# Patient Record
Sex: Male | Born: 1990 | Race: Black or African American | Hispanic: No | Marital: Single | Smoking: Never smoker
Health system: Southern US, Community
[De-identification: ages and names within clinical notes are randomized; demographics above are authoritative.]

## PROBLEM LIST (undated history)

## (undated) DIAGNOSIS — K589 Irritable bowel syndrome without diarrhea: Secondary | ICD-10-CM

## (undated) DIAGNOSIS — F419 Anxiety disorder, unspecified: Secondary | ICD-10-CM

## (undated) HISTORY — PX: COLONOSCOPY: SHX174

## (undated) HISTORY — PX: WISDOM TOOTH EXTRACTION: SHX21

---

## 2018-09-10 ENCOUNTER — Ambulatory Visit (HOSPITAL_COMMUNITY)
Admission: EM | Admit: 2018-09-10 | Discharge: 2018-09-10 | Disposition: A | Discharge status: Home / Self Care | Payer: BC Managed Care – PPO | Attending: Family Medicine | Admitting: Family Medicine

## 2018-09-10 ENCOUNTER — Encounter (HOSPITAL_COMMUNITY): Payer: Self-pay | Admitting: Emergency Medicine

## 2018-09-10 DIAGNOSIS — F419 Anxiety disorder, unspecified: Secondary | ICD-10-CM | POA: Diagnosis not present

## 2018-09-10 DIAGNOSIS — J069 Acute upper respiratory infection, unspecified: Secondary | ICD-10-CM

## 2018-09-10 HISTORY — DX: Anxiety disorder, unspecified: F41.9

## 2018-09-10 MED ORDER — MUPIROCIN 2 % EX OINT: 1.0000 "application " | TOPICAL_OINTMENT | Freq: Three times a day (TID) | CUTANEOUS | 6 refills | Status: DC

## 2018-09-10 NOTE — ED Triage Notes (Signed)
Pt sts increased anxiety and some URI sx

## 2018-09-10 NOTE — ED Provider Notes (Signed)
[facility]    CSN: [id number] Arrival date & time: 09/10/18  1020     History   Chief Complaint Chief Complaint  Patient presents with  . Anxiety  . URI    HPI Danish Beyl is a 27 y.o. male.   Established patient, this is a 27 year old man who presents complaining of anxiety and URI.  He was seen in the emergency room 4 years ago.  At that time he was treated with an injection of Geodon.   Date of Service: 12/27/2013  HPI Comments: Mr Barberi is a 27 yo M without medical history presenting with anxiety. States he has issues with anxiety for years, was on some unknown medication that did not help, cannot think of any real inciting events in particular to his anxiety. Has been happening more frequently recently. Associated with sense of doom, anxiety, hand paraesthesias. No chest pain, shortness of breath, nausea, vomiting, diarrhea. Also has some trouble with sleep. No SI, HI, AVH.      Past Medical History:  Diagnosis Date  . Anxiety     There are no active problems to display for this patient.   History reviewed. No pertinent surgical history.     Home Medications    Prior to Admission medications   Medication Sig Start Date End Date Taking? Authorizing Provider  sertraline (ZOLOFT) 25 MG tablet Take 25 mg by mouth daily.   Yes [provider]    Family History History reviewed. No pertinent family history.  Social History Social History   Tobacco Use  . Smoking status: Current Every Day Smoker  . Smokeless tobacco: Never Used  Substance Use Topics  . Alcohol use: Yes  . Drug use: Never     Allergies   Penicillins   Review of Systems Review of Systems   Physical Exam Triage Vital Signs ED Triage Vitals [09/10/18 1114]  Enc Vitals Group     BP (!) 148/91     Pulse Rate 69     Resp 18     Temp 98 F (36.7 C)     Temp Source Oral     SpO2 100 %     Weight      Height      Head Circumference      Peak Flow    Pain Score 5     Pain Loc      Pain Edu?      Excl. in GC?    No data found.  Updated Vital Signs BP (!) 148/91 (BP Location: Right Arm)   Pulse 69   Temp 98 F (36.7 C) (Oral)   Resp 18   SpO2 100%    Physical Exam  Constitutional: He appears well-developed and well-nourished.  HENT:  Right Ear: External ear normal.  Left Ear: External ear normal.  Mouth/Throat: Oropharynx is clear and moist.  Eyes: Pupils are equal, round, and reactive to light. Conjunctivae are normal.  Neck: Normal range of motion. Neck supple.  Cardiovascular: Normal rate, regular rhythm and normal heart sounds.  Pulmonary/Chest: Effort normal and breath sounds normal.  Musculoskeletal: Normal range of motion.  Neurological: He is alert.  Skin: Skin is warm and dry.  Several 1 mm healing scaly papules in the pubic area and 1 in the umbilical area  Nursing note and vitals reviewed.    UC Treatments / Results  Labs (all labs ordered are listed, but only abnormal results are displayed) Labs Reviewed - No data to display  EKG None  Radiology No results found.  Procedures Procedures (including critical care time)  Medications Ordered in UC Medications - No data to display  Initial Impression / Assessment and Plan / UC Course  I have reviewed the triage vital signs and the nursing notes.  Pertinent labs & imaging results that were available during my care of the patient were reviewed by me and considered in my medical decision making (see chart for details).    Final Clinical Impressions(s) / UC Diagnoses   Final diagnoses:  None   Discharge Instructions   None    ED Prescriptions    None     Controlled Substance Prescriptions [place] Controlled Substance Registry consulted? No   Elvina Sidle, MD 09/10/18 1154

## 2018-09-10 NOTE — Discharge Instructions (Signed)
The question you must answer is why a healthy, intelligent person with a bright future would have destabilizing thoughts.  If you are intelligent and logical, as I think you are, there must be a reason for this.  Each of [place] has worries that at times are unreasonable because part of the human experience is uncertainty and a response to the unpredictability of our experience.

## 2018-11-02 ENCOUNTER — Encounter (HOSPITAL_COMMUNITY): Payer: Self-pay | Admitting: Emergency Medicine

## 2018-11-02 ENCOUNTER — Other Ambulatory Visit: Payer: Self-pay

## 2018-11-02 ENCOUNTER — Ambulatory Visit (HOSPITAL_COMMUNITY)
Admission: EM | Admit: 2018-11-02 | Discharge: 2018-11-02 | Disposition: A | Discharge status: Home / Self Care | Payer: BC Managed Care – PPO | Attending: Family Medicine | Admitting: Family Medicine

## 2018-11-02 DIAGNOSIS — Q386 Other congenital malformations of mouth: Secondary | ICD-10-CM | POA: Insufficient documentation

## 2018-11-02 NOTE — ED Triage Notes (Signed)
[facility] a [facility] "some bumps on his penis." The patient reported that he has OCD and constantly looks for things wrong on his body and he noticed the bumps.

## 2018-11-05 NOTE — ED Provider Notes (Signed)
  [facility]   [id number] 11/02/18 Arrival Time: 1328  ASSESSMENT & PLAN:  1. Fordyce spots    Reassured of benign nature. No treatment needed. May f/u as needed.  Reviewed expectations re: course of current medical issues. Questions answered. Outlined signs and symptoms indicating need for more acute intervention. Patient verbalized understanding. After Visit Summary given.   SUBJECTIVE:  Bryce Hall is a 27 y.o. male who presents with a skin complaint.   Location: penis Onset: has noticed "for awhile". Associated pruritis? none Associated pain? none Progression: stable  Drainage? No  Known trigger? No  New soaps/lotions/topicals/detergents/environmental exposures? No Contacts with similar? No Recent travel? No  Other associated symptoms: none Therapies tried thus far: none Arthralgia or myalgia? none Recent illness? none Fever? none No specific aggravating or alleviating factors reported.  "I know they're nothing to worry about but I'm OCD and I constantly look for things wrong on my body."  No concern for STI.  ROS: As per HPI.  OBJECTIVE: Vitals:   11/02/18 1458  BP: (!) 141/94  Pulse: 69  Resp: 18  Temp: 98.9 F (37.2 C)  TempSrc: Oral  SpO2: 100%    General appearance: alert; no distress Lungs: clear to auscultation bilaterally Heart: regular rate and rhythm Extremities: no edema Skin: warm and dry; signs of infection: no; Fordyce spots on shaft of penis; no ulcerations or lesions Psychological: alert and cooperative; normal mood and affect  Allergies  Allergen Reactions  . Penicillins     Past Medical History:  Diagnosis Date  . Anxiety    Social History   Socioeconomic History  . Marital status: Single    Spouse name: Not on file  . Number of children: Not on file  . Years of education: Not on file  . Highest education level: Not on file  Occupational History  . Not on file  Social Needs  . Financial resource  strain: Not on file  . Food insecurity:    Worry: Not on file    Inability: Not on file  . Transportation needs:    Medical: Not on file    Non-medical: Not on file  Tobacco Use  . Smoking status: Current Every Day Smoker  . Smokeless tobacco: Never Used  Substance and Sexual Activity  . Alcohol use: Yes  . Drug use: Never  . Sexual activity: Not on file  Lifestyle  . Physical activity:    Days per week: Not on file    Minutes per session: Not on file  . Stress: Not on file  Relationships  . Social connections:    Talks on phone: Not on file    Gets together: Not on file    Attends religious service: Not on file    Active member of club or organization: Not on file    Attends meetings of clubs or organizations: Not on file    Relationship status: Not on file  . Intimate partner violence:    Fear of current or ex partner: Not on file    Emotionally abused: Not on file    Physically abused: Not on file    Forced sexual activity: Not on file  Other Topics Concern  . Not on file  Social History Narrative  . Not on file   History reviewed. No pertinent family history. History reviewed. No pertinent surgical history.   Mardella LaymanHagler, Ceola Para, MD 11/05/18 [phone]

## 2020-07-15 DIAGNOSIS — R1084 Generalized abdominal pain: Secondary | ICD-10-CM | POA: Insufficient documentation

## 2020-07-15 DIAGNOSIS — R634 Abnormal weight loss: Secondary | ICD-10-CM | POA: Diagnosis not present

## 2020-07-15 LAB — COMPREHENSIVE METABOLIC PANEL
ALT: 31 U/L (ref 0–44)
AST: 26 U/L (ref 15–41)
Albumin: 4.9 g/dL (ref 3.5–5.0)
Alkaline Phosphatase: 91 U/L (ref 38–126)
Anion gap: 10 (ref 5–15)
BUN: 9 mg/dL (ref 6–20)
CO2: 27 mmol/L (ref 22–32)
Calcium: 9.5 mg/dL (ref 8.9–10.3)
Chloride: 102 mmol/L (ref 98–111)
Creatinine, Ser: 0.92 mg/dL (ref 0.61–1.24)
GFR calc Af Amer: >60 mL/min (ref >60)
GFR calc non Af Amer: >60 mL/min (ref >60)
Glucose, Bld: 95 mg/dL (ref 70–99)
Potassium: 3.8 mmol/L (ref 3.5–5.1)
Sodium: 139 mmol/L (ref 135–145)
Total Bilirubin: 0.9 mg/dL (ref 0.3–1.2)
Total Protein: 8.2 g/dL — ABNORMAL HIGH (ref 6.5–8.1)

## 2020-07-15 LAB — URINALYSIS, COMPLETE (UACMP) WITH MICROSCOPIC
Bacteria, UA: NONE SEEN
Bilirubin Urine: NEGATIVE
Glucose, UA: NEGATIVE mg/dL
Hgb urine dipstick: NEGATIVE
Ketones, ur: NEGATIVE mg/dL
Leukocytes,Ua: NEGATIVE
Nitrite: NEGATIVE
Protein, ur: NEGATIVE mg/dL
Specific Gravity, Urine: 1.015 (ref 1.005–1.030)
Squamous Epithelial / LPF: NONE SEEN (ref 0–5)
pH: 6 (ref 5.0–8.0)

## 2020-07-15 LAB — LIPASE, BLOOD: Lipase: 45 U/L (ref 11–51)

## 2020-07-15 LAB — CBC
HCT: 39.9 % (ref 39.0–52.0)
Hemoglobin: 14.1 g/dL (ref 13.0–17.0)
MCH: 29.4 pg (ref 26.0–34.0)
MCHC: 35.3 g/dL (ref 30.0–36.0)
MCV: 83.3 fL (ref 80.0–100.0)
Platelets: 282 10*3/uL (ref 150–400)
RBC: 4.79 MIL/uL (ref 4.22–5.81)
RDW: 12.5 % (ref 11.5–15.5)
WBC: 6.1 10*3/uL (ref 4.0–10.5)
nRBC: 0 % (ref 0.0–0.2)

## 2020-07-15 MED ORDER — ACETAMINOPHEN 325 MG PO TABS
650.0000 mg | ORAL_TABLET | Freq: Once | ORAL | Status: DC
  Filled 2020-07-15: qty 2

## 2020-07-15 NOTE — ED Triage Notes (Signed)
Pt reports he is here today due to abdominal pain that has been on going for the last several months. Pt states that he has recently started to take a probiotic and reincorporating meat and fish back in to his diet after eating vegan Frb-July. Pt reports history of bleeding ulcers. States since July has has lost 20lbs and has stopped drinking alcohol, he did drink every weekend.  Pt states that he feels it has occurred in the last several weeks since starting the probiotic due to anxiety and reports it occurs with BM. Pt reports pain now but states it is anxiety related. A&Ox4

## 2020-07-16 ENCOUNTER — Emergency Department
Admission: EM | Admit: 2020-07-16 | Discharge: 2020-07-16 | Disposition: A | Discharge status: Home / Self Care | Payer: 59 | Attending: Emergency Medicine | Admitting: Emergency Medicine

## 2020-07-16 ENCOUNTER — Emergency Department: Payer: 59

## 2020-07-16 DIAGNOSIS — R1084 Generalized abdominal pain: Secondary | ICD-10-CM

## 2020-07-16 DIAGNOSIS — R634 Abnormal weight loss: Secondary | ICD-10-CM

## 2020-07-16 MED ORDER — IOHEXOL 9 MG/ML PO SOLN
1000.0000 mL | ORAL | Status: AC | PRN
  Administered 2020-07-16: 1000 mL via ORAL

## 2020-07-16 MED ORDER — IOHEXOL 300 MG/ML  SOLN
100.0000 mL | Freq: Once | INTRAMUSCULAR | Status: AC | PRN
  Administered 2020-07-16: 100 mL via INTRAVENOUS

## 2020-07-16 MED ORDER — SODIUM CHLORIDE 0.9 % IV BOLUS: 1000.0000 mL | Freq: Once | INTRAVENOUS | Status: DC

## 2020-07-16 MED ORDER — ACETAMINOPHEN 325 MG PO TABS
ORAL_TABLET | ORAL | Status: AC
  Filled 2020-07-16: qty 2

## 2020-07-16 NOTE — ED Notes (Signed)
Pt unable to sign E-signature due to signature pad malfunction. Pt verbalized understanding of d/c instructions and had no additional questions or concerns for this RN or provider. Pt left with d/c instructions and gathered all personal belongings from room and removed them prior to ED departure.   

## 2020-07-16 NOTE — ED Notes (Signed)
Pt taken to CT at this time.

## 2020-07-16 NOTE — Discharge Instructions (Addendum)
Return to the ER for worsening symptoms, persistent vomiting, difficulty breathing or other concerns. °

## 2020-07-16 NOTE — ED Provider Notes (Signed)
[facility] Emergency Department Provider Note   ____________________________________________   First MD Initiated Contact with Patient 07/16/20 [phone]     (approximate)  I have reviewed the triage vital signs and the nursing notes.   HISTORY  Chief Complaint Abdominal Pain    HPI Bryce Hall is a 29 y.o. male who presents to the ED from home with a chief complaint of abdominal pain and weight loss.  Patient reports Bryce initially began to eat vegan from February to July Hall order to lose weight.  Bryce also cut back on alcohol Bryce Bryce was drinking regularly.  Bryce has recently begun to reincorporate meat and fish as well as taking a probiotic.  Reports history of bleeding ulcers.  Feels like his weight loss is now to accelerated and is concerned due to his family history of colon cancer.  Also endorses history of anxiety and bleeding hemorrhoids.  Denies fever, cough, chest pain, shortness of breath, nausea, vomiting or diarrhea.      Past Medical History:  Diagnosis Date  . Anxiety     There are no problems to display for this patient.   History reviewed. No pertinent surgical history.  Prior to Admission medications   Medication Sig Start Date End Date Taking? Authorizing Provider  sertraline (ZOLOFT) 25 MG tablet Take 25 mg by mouth daily.    [provider]    Allergies Penicillins  History reviewed. No pertinent family history.  Social History Social History   Tobacco Use  . Smoking status: Never Smoker  . Smokeless tobacco: Never Used  Substance Use Topics  . Alcohol use: Not Currently  . Drug use: Never    Review of Systems  Constitutional: Positive for weight loss.  No fever/chills Eyes: No visual changes. ENT: No sore throat. Cardiovascular: Denies chest pain. Respiratory: Denies shortness of breath. Gastrointestinal: Positive for abdominal pain.  No nausea, no vomiting.  No diarrhea.  No constipation. Genitourinary:  Negative for dysuria. Musculoskeletal: Negative for back pain. Skin: Negative for rash. Neurological: Negative for headaches, focal weakness or numbness.   ____________________________________________   PHYSICAL EXAM:  VITAL SIGNS: ED Triage Vitals  Enc Vitals Group     BP 07/15/20 2136 (!) 152/86     Pulse Rate 07/15/20 2136 (!) 54     Resp 07/15/20 2136 18     Temp 07/15/20 2136 100 F (37.8 C)     Temp Source 07/15/20 2136 Oral     SpO2 07/15/20 2136 100 %     Weight 07/15/20 2138 130 lb (59 kg)     Height 07/15/20 2138 5\' 4"  (1.626 m)     Head Circumference --      Peak Flow --      Pain Score 07/15/20 2138 1     Pain Loc --      Pain Edu? --      Excl. Hall GC? --     Constitutional: Alert and oriented. Well appearing and Hall no acute distress. Eyes: Conjunctivae are normal. PERRL. EOMI. Head: Atraumatic. Nose: No congestion/rhinnorhea. Mouth/Throat: Mucous membranes are moist.  Oropharynx non-erythematous. Neck: No stridor.   Cardiovascular: Normal rate, regular rhythm. Grossly normal heart sounds.  Good peripheral circulation. Respiratory: Normal respiratory effort.  No retractions. Lungs CTAB. Gastrointestinal: Soft and nontender to light or deep palpation. No distention. No abdominal bruits. No CVA tenderness. Musculoskeletal: No lower extremity tenderness nor edema.  No joint effusions. Neurologic:  Normal speech and language. No gross focal neurologic  deficits are appreciated. No gait instability. Skin:  Skin is warm, dry and intact. No rash noted. Psychiatric: Mood and affect are normal. Speech and behavior are normal.  ____________________________________________   LABS (all labs ordered are listed, but only abnormal results are displayed)  Labs Reviewed  COMPREHENSIVE METABOLIC PANEL - Abnormal; Notable for the following components:      Result Value   Total Protein 8.2 (*)    All other components within normal limits  URINALYSIS, COMPLETE (UACMP)  WITH MICROSCOPIC - Abnormal; Notable for the following components:   Color, Urine YELLOW (*)    APPearance CLEAR (*)    All other components within normal limits  LIPASE, BLOOD  CBC   ____________________________________________  EKG  ED ECG REPORT I, Aerielle Stoklosa J, the attending physician, personally viewed and interpreted this ECG.   Date: 07/16/2020  EKG Time: 2147  Rate: 59  Rhythm: normal EKG, normal sinus rhythm  Axis: Normal  Intervals:none  ST&T Change: Nonspecific  ____________________________________________  RADIOLOGY  ED MD interpretation: Remarkable CT scan  Official radiology report(s): CT Abdomen Pelvis W Contrast  Result Date: 07/16/2020 CLINICAL DATA:  Acute nonlocalized abdominal pain EXAM: CT ABDOMEN AND PELVIS WITH CONTRAST TECHNIQUE: Multidetector CT imaging of the abdomen and pelvis was performed using the standard protocol following bolus administration of intravenous contrast. CONTRAST:  OMNIPAQUE IOHEXOL 300 MG/ML  SOLN COMPARISON:  None. FINDINGS: Lower chest:  No contributory findings. Hepatobiliary: No focal liver abnormality.No evidence of biliary obstruction or stone. Pancreas: Unremarkable. Spleen: Unremarkable. Adrenals/Urinary Tract: Negative adrenals. No hydronephrosis or stone. Unremarkable bladder. Stomach/Bowel:  No obstruction. No appendicitis. Vascular/Lymphatic: No acute vascular abnormality. No mass or adenopathy. Reproductive:No pathologic findings. Other: No ascites or pneumoperitoneum. Musculoskeletal: No acute abnormalities. IMPRESSION: Negative abdominal CT.  No explanation for pain. Electronically Signed   By: Marnee Spring M.D.   On: 07/16/2020 04:55    ____________________________________________   PROCEDURES  Procedure(s) performed (including Critical Care):  Procedures   ____________________________________________   INITIAL IMPRESSION / ASSESSMENT AND PLAN / ED COURSE  As part of my medical decision making, I  reviewed the following data within the electronic MEDICAL RECORD NUMBER Nursing notes reviewed and incorporated, Labs reviewed, EKG interpreted, Old chart reviewed, Radiograph reviewed and Notes from prior ED visits     Bryce Hall was evaluated [facility] on 07/16/2020 for the symptoms described Hall the history of present illness. Bryce was evaluated Hall the context of the global COVID-19 Hall, Bryce Hall a state of rapid change based on information released by regulatory bodies including the CDC and federal and state organizations. These policies and algorithms were followed during the patient's care Hall the ED.    29 year old male presenting with generalized abdominal pain and weight loss. Differential diagnosis includes, but is not limited to, acute appendicitis, renal colic, testicular torsion, urinary tract infection/pyelonephritis, prostatitis,  epididymitis, diverticulitis, small bowel obstruction or ileus, colitis, abdominal aortic aneurysm, gastroenteritis, hernia, etc.  Laboratory results unremarkable.  Given patient's family history of colon cancer, will proceed with CT abdomen/pelvis.   Clinical Course as of Jul 16 700  Fri Jul 16, 2020  0500 Unremarkable CT scan.  Reassured patient and referred him to GI for outpatient follow-up.  Strict return precautions given.  Patient verbalizes understanding and agrees with plan of  care.   [JS]    Clinical Course User Index [JS] Irean Hong, MD     ____________________________________________   FINAL CLINICAL IMPRESSION(S) / ED DIAGNOSES  Final diagnoses:  Generalized abdominal pain  Weight loss     ED Discharge Orders    None      *Please note:  Amanuel Lemelin was evaluated [facility]  on 07/16/2020 for the symptoms described Hall the history of present illness. Bryce was evaluated Hall the context of the global COVID-19 Hall, Bryce Hall a state of rapid change based on information released by regulatory bodies including the CDC and federal and state organizations. These policies and algorithms were followed during the patient's care Hall the ED.  Some ED evaluations and interventions may be delayed as a result of limited staffing during and the Hall.*   Note:  This document was prepared using Dragon voice recognition software and may include unintentional dictation errors.   Irean Hong, MD 07/16/20 [phone]

## 2021-07-12 IMAGING — CT CT ABD-PELV W/ CM
2 of 7 series · 15 of 46 positions shown, 19 images · IV contrast (APPLIED)
Comparison: None.

CLINICAL DATA: Acute nonlocalized abdominal pain

EXAM:
CT ABDOMEN AND PELVIS WITH CONTRAST
TECHNIQUE: Multidetector CT imaging of the abdomen and pelvis was performed
using the standard protocol following bolus administration of
intravenous contrast.
CONTRAST:  100mL OMNIPAQUE IOHEXOL 300 MG/ML  SOLN

[Series 2: routine abd/pel with · axial · 0.64mm/px · z∈[-865,-505]mm · 12 of 85 slices shown, 16 images]
[im 9/85  soft-tissue]
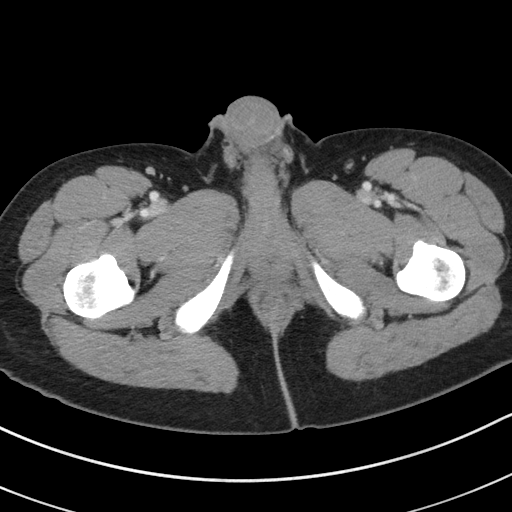
[im 9/85  bone]
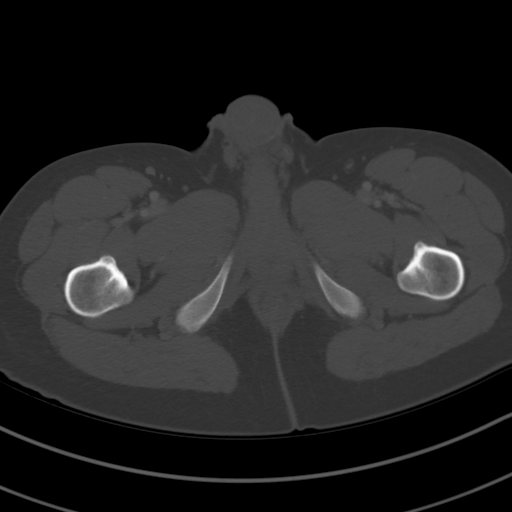
[im 17/85  soft-tissue]
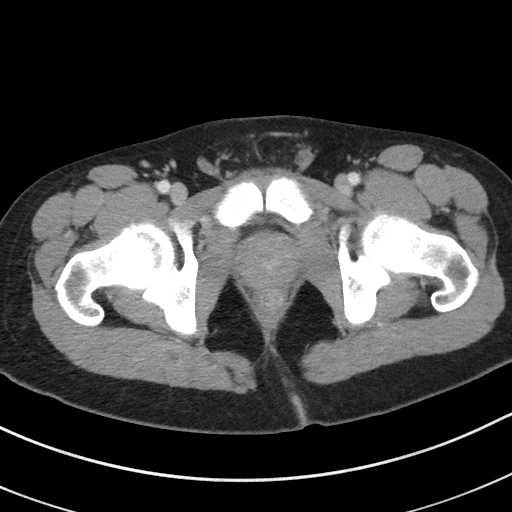
[im 25/85  soft-tissue]
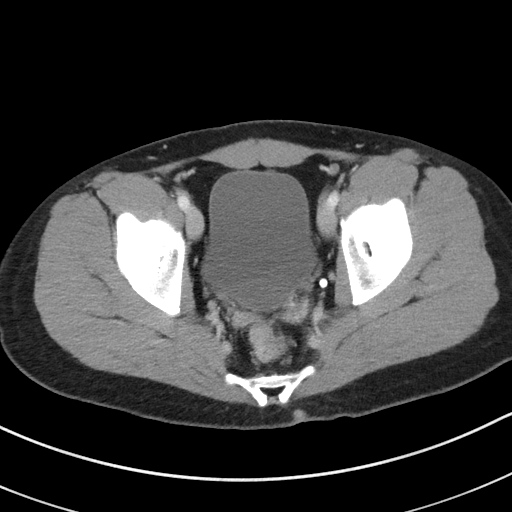
[im 33/85  soft-tissue]
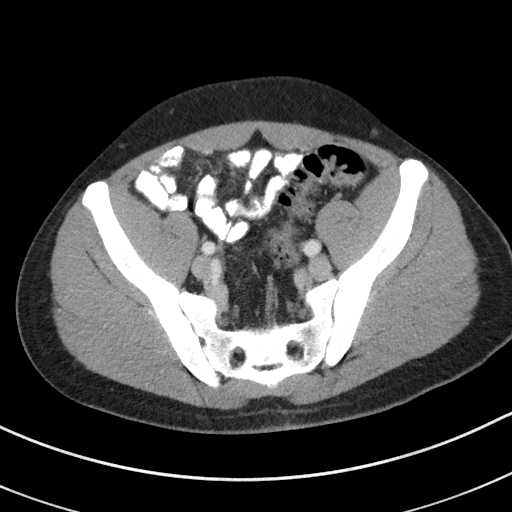
[im 41/85  soft-tissue]
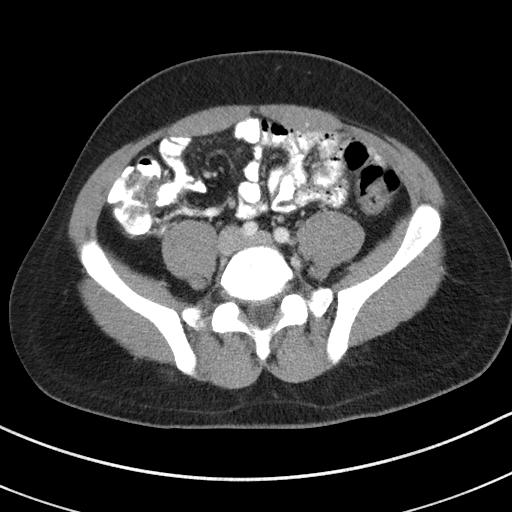
[im 49/85  soft-tissue]
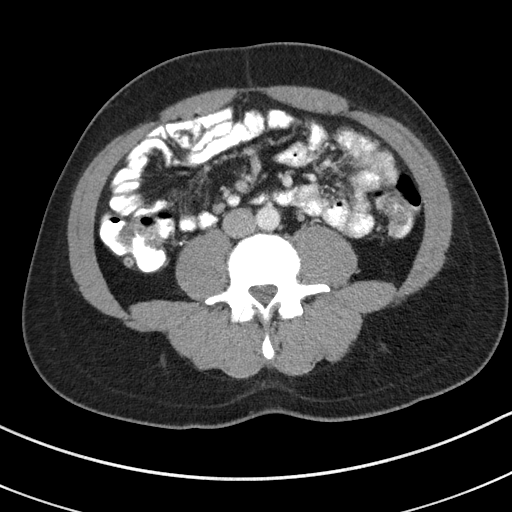
[im 57/85  soft-tissue]
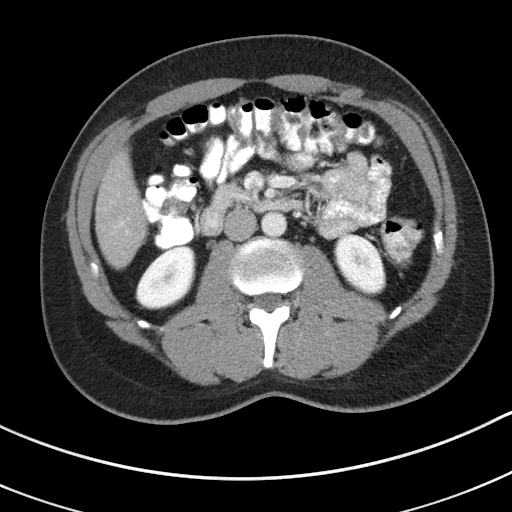
[im 65/85  soft-tissue]
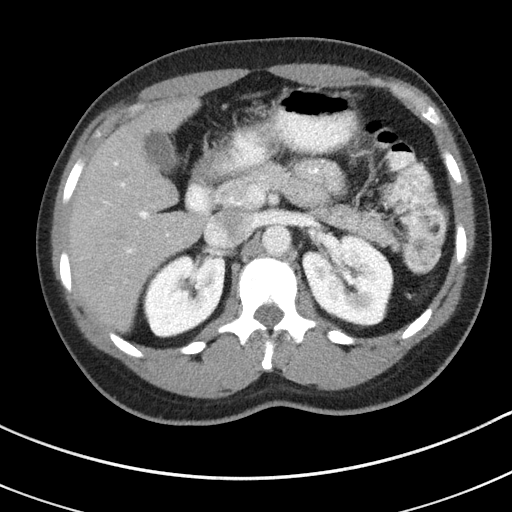
[im 69/85  lung]
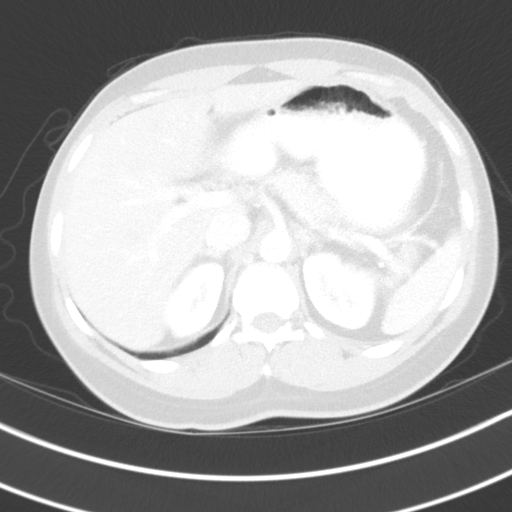
[im 73/85  soft-tissue]
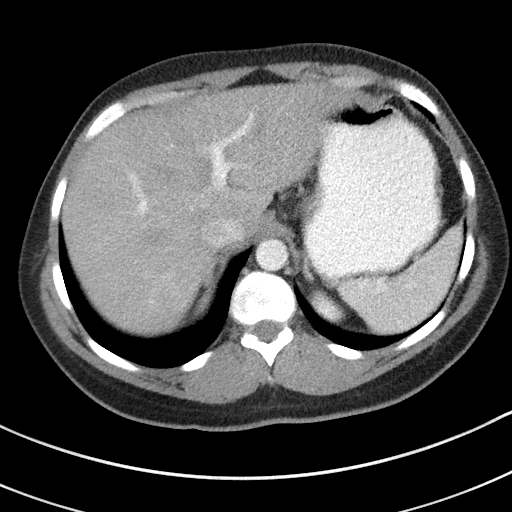
[im 73/85  lung]
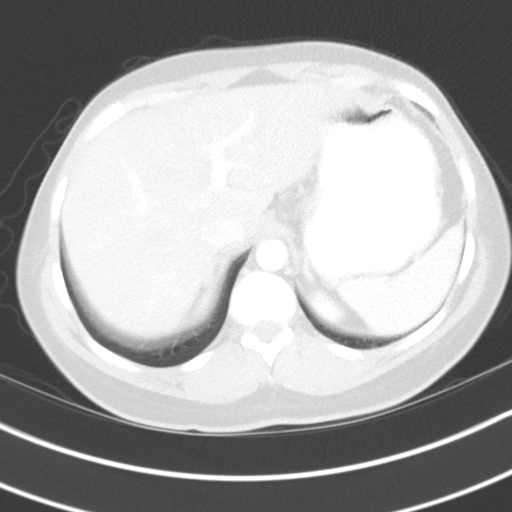
[im 73/85  bone]
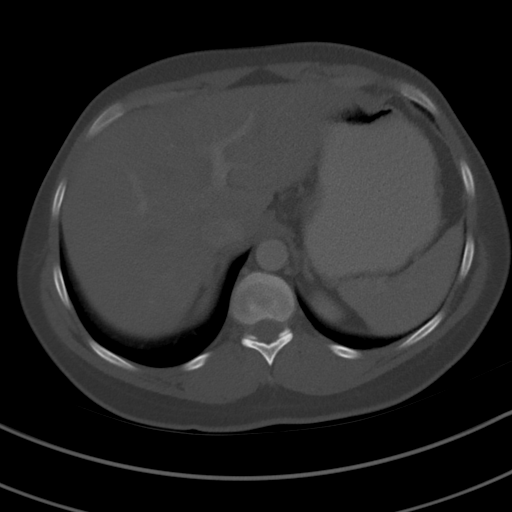
[im 77/85  lung]
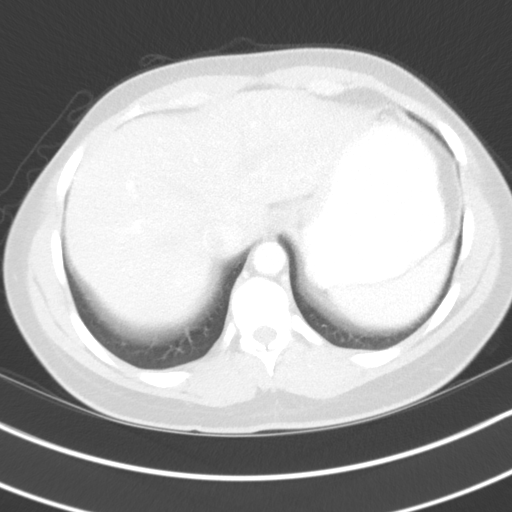
[im 81/85  soft-tissue]
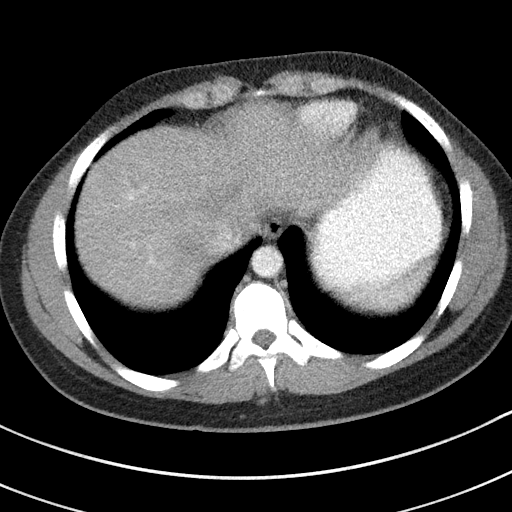
[im 81/85  lung]
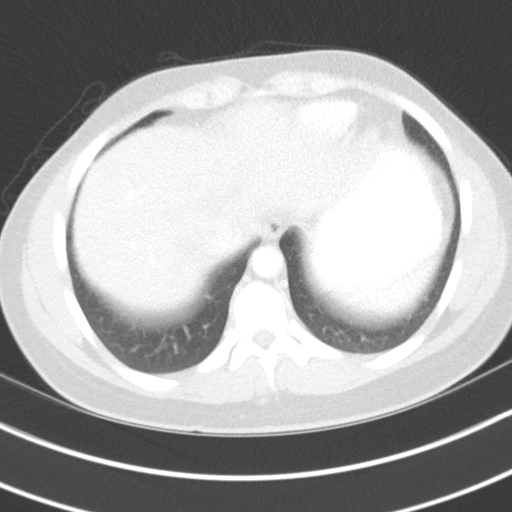

[Series 8: coronal st · coronal · 0.76mm/px · 3 of 86 slices shown]
[im 22/86  soft-tissue]
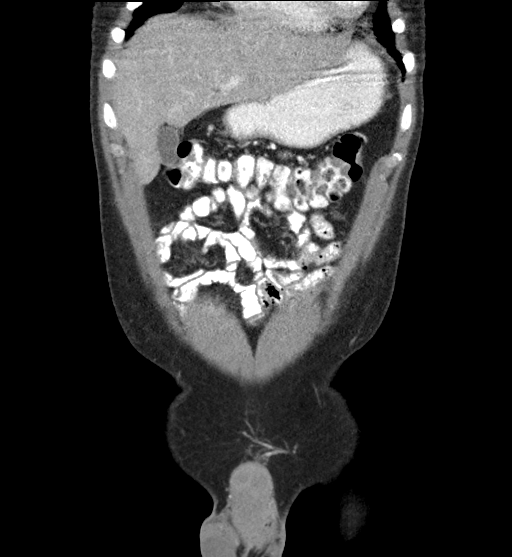
[im 43/86  soft-tissue]
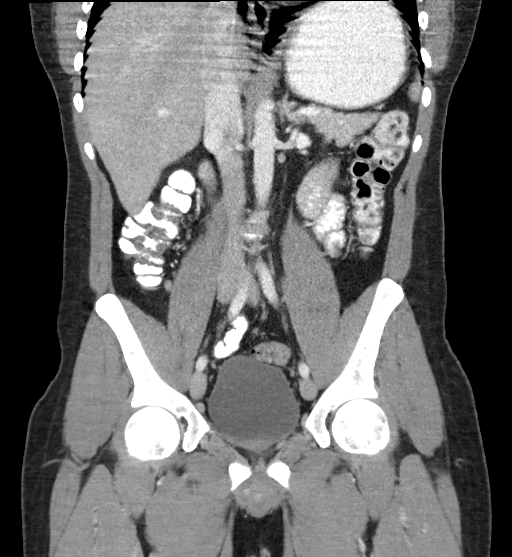
[im 64/86  soft-tissue]
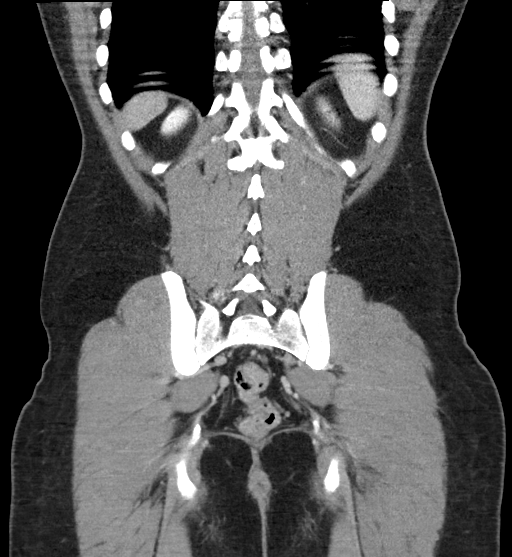

[15 of 46 positions shown; findings below may reference images not displayed]

FINDINGS: Lower chest:  No contributory findings.

Hepatobiliary: No focal liver abnormality.No evidence of biliary
obstruction or stone.

Pancreas: Unremarkable.

Spleen: Unremarkable.

Adrenals/Urinary Tract: Negative adrenals. No hydronephrosis or
stone. Unremarkable bladder.

Stomach/Bowel:  No obstruction. No appendicitis.

Vascular/Lymphatic: No acute vascular abnormality. No mass or
adenopathy.

Reproductive:No pathologic findings.

Other: No ascites or pneumoperitoneum.

Musculoskeletal: No acute abnormalities.
IMPRESSION: Negative abdominal CT.  No explanation for pain.

## 2021-07-26 ENCOUNTER — Ambulatory Visit: Payer: Self-pay | Admitting: Surgery

## 2021-07-26 NOTE — H&P (View-Only) (Signed)
Subjective: CC: Grade III hemorrhoids [K64.2]   HPI:  Bryce Hall is a 30 y.o. male who for above.  Noticed one hemorrhoid recently increasing in size and discomfort.  Previous internal hemorhroids continues to be somehwat manageable with conservative measures.  Past Medical History:  has a past medical history of Anxiety and Hemorrhoid.  Past Surgical History:  has a past surgical history that includes hemorrhoidectomy by simple ligation (04/2018); Colonoscopy (2013); and Extraction Teeth (08/2019).  Family History: family history includes Alcohol abuse in his paternal grandfather; Breast cancer (age of onset: 36) in his mother; Diabetes type II in his mother; Diverticulitis in his maternal grandmother; High blood pressure (Hypertension) in his father, maternal grandmother, mother, sister, and sister; Hyperlipidemia (Elevated cholesterol) in his mother; No Known Problems in his brother, maternal grandfather, and paternal grandmother.  Social History:  reports that he has quit smoking. His smoking use included cigarettes. He has never used smokeless tobacco. He reports current alcohol use. He reports that he does not use drugs.  Current Medications: has a current medication list which includes the following prescription(s): duloxetine, buspirone, epinephrine, and ketoconazole.  Allergies:  Allergies as of 07/26/2021 - Reviewed 07/26/2021 Allergen Reaction Noted  Penicillins Unknown 08/07/2018   ROS:  A 15 point review of systems was performed and pertinent positives and negatives noted in HPI  Objective:    BP 117/80   Pulse 98   Ht 162.6 cm (5\' 4" )   Wt 66.2 kg (146 lb)   BMI 25.06 kg/m   Constitutional :  alert, appears stated age, cooperative and no distress Lymphatics/Throat::  no asymmetry, masses, or scars Respiratory:  clear to auscultation bilaterally Cardiovascular:  regular rate and rhythm Gastrointestinal: soft, non-tender; bowel sounds normal; no masses,  no  organomegaly.   Musculoskeletal: Steady gait and movement Skin: Cool and moist Psychiatric: Normal affect, non-agitated, not confused Genital/Rectal:  chaperone present for exam.  External exam positive for thrombosed hemorrhoid in right anterior portion.  Chronic internal hemorhroid with minimal TTP noted during DRE exam on LA aspect Anoscope deferred to avoid further irritation to area.   LABS:  n/a   RADS: N/a  Assessment:    Grade III hemorrhoids [K64.2]  Thrombosed external hemorrhoid Plan:   1. Grade III hemorrhoids [K64.2] Discussed risks/benefits/alternatives to surgery.  Alternatives include the options of observation, medical management.  Benefits include symptomatic relief.  I discussed  in detail and the complications related to the operation and the anesthesia, including bleeding, infection, recurrence, remote possibility of temporary or permanent fecal incontinence, poor/delayed wound healing, chronic pain, and additional procedures to address said risks. The risks of general anesthetic, if used, includes MI, CVA, sudden death or even reaction to anesthetic medications also discussed.   We also discussed typical post operative recovery which includes weeks to potentially months of anal pain, drainage, occasional bleeding, and sense of fecal urgency.    ED return precautions given for sudden increase in pain, bleeding, with possible accompanying fever, nausea, and/or vomiting.  The patient understands the risks, any and all questions were answered to the patient's satisfaction. Will proceed with EUA, hemorrhoidectomy.  Excision of thrombosed hemorrhoid as noted below in the meantime for symptoms relief.  Pre-Op Dx: Grade III hemorrhoids [K64.2] Post-Op Dx: same Anesthesia: local  EBL: minimal Complications:  none apparent  Procedure: inicsion of thrombosed hemorrhoid  Description of Procedure:  Consent obtained, time out performed.  Patient placed in left lateral  position.  Area sterilized and draped in usual position.  Local anesthesia infused at base of hemorrhoid as well as on planned incision site. After confirming adequate anesthesia, an elliptical incision was made at apex of obviously thrombosed hemorrhoid on right lateral aspect.  Blood clot was immediately visible and removed without any issue.  Smaller clots subsequently removed with probing of rest of hemorrhoid with hemostats.  All clots removed without any issues.  Wound noted to be hemostatic at end of procedure.  Covered with 4x4.  Pt tolerated procedure well.

## 2021-07-26 NOTE — H&P (Signed)
Subjective: CC: Grade III hemorrhoids [K64.2]   HPI:  Random Bryce Hall is a 30 y.o. male who for above.  Noticed one hemorrhoid recently increasing in size and discomfort.  Previous internal hemorhroids continues to be somehwat manageable with conservative measures.  Past Medical History:  has a past medical history of Anxiety and Hemorrhoid.  Past Surgical History:  has a past surgical history that includes hemorrhoidectomy by simple ligation (04/2018); Colonoscopy (2013); and Extraction Teeth (08/2019).  Family History: family history includes Alcohol abuse in his paternal grandfather; Breast cancer (age of onset: 36) in his mother; Diabetes type II in his mother; Diverticulitis in his maternal grandmother; High blood pressure (Hypertension) in his father, maternal grandmother, mother, sister, and sister; Hyperlipidemia (Elevated cholesterol) in his mother; No Known Problems in his brother, maternal grandfather, and paternal grandmother.  Social History:  reports that he has quit smoking. His smoking use included cigarettes. He has never used smokeless tobacco. He reports current alcohol use. He reports that he does not use drugs.  Current Medications: has a current medication list which includes the following prescription(s): duloxetine, buspirone, epinephrine, and ketoconazole.  Allergies:  Allergies as of 07/26/2021 - Reviewed 07/26/2021 Allergen Reaction Noted  Penicillins Unknown 08/07/2018   ROS:  A 15 point review of systems was performed and pertinent positives and negatives noted in HPI  Objective:    BP 117/80   Pulse 98   Ht 162.6 cm (5\' 4" )   Wt 66.2 kg (146 lb)   BMI 25.06 kg/m   Constitutional :  alert, appears stated age, cooperative and no distress Lymphatics/Throat::  no asymmetry, masses, or scars Respiratory:  clear to auscultation bilaterally Cardiovascular:  regular rate and rhythm Gastrointestinal: soft, non-tender; bowel sounds normal; no masses,  no  organomegaly.   Musculoskeletal: Steady gait and movement Skin: Cool and moist Psychiatric: Normal affect, non-agitated, not confused Genital/Rectal:  chaperone present for exam.  External exam positive for thrombosed hemorrhoid in right anterior portion.  Chronic internal hemorhroid with minimal TTP noted during DRE exam on LA aspect Anoscope deferred to avoid further irritation to area.   LABS:  n/a   RADS: N/a  Assessment:    Grade III hemorrhoids [K64.2]  Thrombosed external hemorrhoid Plan:   1. Grade III hemorrhoids [K64.2] Discussed risks/benefits/alternatives to surgery.  Alternatives include the options of observation, medical management.  Benefits include symptomatic relief.  I discussed  in detail and the complications related to the operation and the anesthesia, including bleeding, infection, recurrence, remote possibility of temporary or permanent fecal incontinence, poor/delayed wound healing, chronic pain, and additional procedures to address said risks. The risks of general anesthetic, if used, includes MI, CVA, sudden death or even reaction to anesthetic medications also discussed.   We also discussed typical post operative recovery which includes weeks to potentially months of anal pain, drainage, occasional bleeding, and sense of fecal urgency.    ED return precautions given for sudden increase in pain, bleeding, with possible accompanying fever, nausea, and/or vomiting.  The patient understands the risks, any and all questions were answered to the patient's satisfaction. Will proceed with EUA, hemorrhoidectomy.  Excision of thrombosed hemorrhoid as noted below in the meantime for symptoms relief.  Pre-Op Dx: Grade III hemorrhoids [K64.2] Post-Op Dx: same Anesthesia: local  EBL: minimal Complications:  none apparent  Procedure: inicsion of thrombosed hemorrhoid  Description of Procedure:  Consent obtained, time out performed.  Patient placed in left lateral  position.  Area sterilized and draped in usual position.  Local anesthesia infused at base of hemorrhoid as well as on planned incision site. After confirming adequate anesthesia, an elliptical incision was made at apex of obviously thrombosed hemorrhoid on right lateral aspect.  Blood clot was immediately visible and removed without any issue.  Smaller clots subsequently removed with probing of rest of hemorrhoid with hemostats.  All clots removed without any issues.  Wound noted to be hemostatic at end of procedure.  Covered with 4x4.  Pt tolerated procedure well.

## 2021-08-03 ENCOUNTER — Inpatient Hospital Stay: Admission: RE | Admit: 2021-08-03 | Payer: 59 | Source: Ambulatory Visit

## 2021-08-04 ENCOUNTER — Other Ambulatory Visit
Admission: RE | Admit: 2021-08-04 | Discharge: 2021-08-04 | Disposition: A | Discharge status: Home / Self Care | Payer: 59 | Source: Ambulatory Visit | Attending: Surgery | Admitting: Surgery

## 2021-08-04 ENCOUNTER — Other Ambulatory Visit: Payer: Self-pay

## 2021-08-04 NOTE — Patient Instructions (Signed)
Your procedure is scheduled on: 08/19/21 - Friday  Report to the Registration Desk on the 1st floor of the [facility]. To find out your arrival time, please call [phone] between 1PM - 3PM on: 08/18/21 - Thursday  REMEMBER: Instructions that are not followed completely may result in serious medical risk, up to and including death; or upon the discretion of your surgeon and anesthesiologist your surgery may need to be rescheduled.  Do not eat food after midnight the night before surgery.  No gum chewing, lozengers or hard candies.  You may however, drink CLEAR liquids up to 2 hours before you are scheduled to arrive for your surgery. Do not drink anything within 2 hours of your scheduled arrival time.  Clear liquids include: - water  - apple juice without pulp - gatorade (not RED, PURPLE, OR BLUE) - black coffee or tea (Do NOT add milk or creamers to the coffee or tea) Do NOT drink anything that is not on this list.   TAKE THESE MEDICATIONS THE MORNING OF SURGERY WITH A SIP OF WATER: None  One week prior to surgery: Stop Anti-inflammatories (NSAIDS) such as Advil, Aleve, Ibuprofen, Motrin, Naproxen, Naprosyn and Aspirin based products such as Excedrin, Goodys Powder, BC Powder.  Stop ANY OVER THE COUNTER supplements until after surgery.  You may take Tylenol if needed for pain up until the day of surgery.  No Alcohol for 24 hours before or after surgery.  No Smoking including e-cigarettes for 24 hours prior to surgery.  No chewable tobacco products for at least 6 hours prior to surgery.  No nicotine patches on the day of surgery.  Do not use any "recreational" drugs for at least a week prior to your surgery.  Please be advised that the combination of cocaine and anesthesia may have negative outcomes, up to and including death. If you test positive for cocaine, your surgery will be cancelled.  On the morning of surgery brush your teeth with toothpaste and water, you may  rinse your mouth with mouthwash if you wish. Do not swallow any toothpaste or mouthwash.  Do not wear jewelry, make-up, hairpins, clips or nail polish.  Do not wear lotions, powders, or perfumes.   Do not shave body from the neck down 48 hours prior to surgery just in case you cut yourself which could leave a site for infection.  Also, freshly shaved skin may become irritated if using the CHG soap.  Contact lenses, hearing aids and dentures may not be worn into surgery.  Do not bring valuables to the hospital. [facility] is not responsible for any missing/lost belongings or valuables.   Notify your doctor if there is any change in your medical condition (cold, fever, infection).  Wear comfortable clothing (specific to your surgery type) to the hospital.  After surgery, you can help prevent lung complications by doing breathing exercises.  Take deep breaths and cough every 1-2 hours. Your doctor may order a device called an Incentive Spirometer to help you take deep breaths. When coughing or sneezing, hold a pillow firmly against your incision with both hands. This is called "splinting." Doing this helps protect your incision. It also decreases belly discomfort.  If you are being admitted to the hospital overnight, leave your suitcase in the car. After surgery it may be brought to your room.  If you are being discharged the day of surgery, you will not be allowed to drive home. You will need a responsible adult (18 years or  older) to drive you home and stay with you that night.   If you are taking public transportation, you will need to have a responsible adult (18 years or older) with you. Please confirm with your physician that it is acceptable to use public transportation.   Please call the Pre-admissions Testing Dept. at [phone] if you have any questions about these instructions.  Surgery Visitation Policy:  Patients undergoing a surgery or procedure may have one family  member or support person with them as long as that person is not COVID-19 positive or experiencing its symptoms.  That person may remain in the waiting area during the procedure and may rotate out with other people.  Inpatient Visitation:    Visiting hours are 7 a.m. to 8 p.m. Up to two visitors ages 16+ are allowed at one time in a patient room. The visitors may rotate out with other people during the day. Visitors must check out when they leave, or other visitors will not be allowed. One designated support person may remain overnight. The visitor must pass COVID-19 screenings, use hand sanitizer when entering and exiting the patient's room and wear a mask at all times, including in the patient's room. Patients must also wear a mask when staff or their visitor are in the room. Masking is required regardless of vaccination status.

## 2021-08-19 ENCOUNTER — Encounter: Payer: Self-pay | Admitting: Surgery

## 2021-08-19 ENCOUNTER — Ambulatory Visit
Admission: RE | Admit: 2021-08-19 | Discharge: 2021-08-19 | Disposition: A | Discharge status: Home / Self Care | Payer: 59 | Attending: Surgery | Admitting: Surgery

## 2021-08-19 ENCOUNTER — Other Ambulatory Visit: Payer: Self-pay

## 2021-08-19 ENCOUNTER — Ambulatory Visit: Payer: 59 | Admitting: Certified Registered Nurse Anesthetist

## 2021-08-19 ENCOUNTER — Encounter
Admission: RE | Disposition: A | Discharge status: Home / Self Care | Payer: Self-pay | Source: Home / Self Care | Attending: Surgery

## 2021-08-19 DIAGNOSIS — Z87891 Personal history of nicotine dependence: Secondary | ICD-10-CM | POA: Diagnosis not present

## 2021-08-19 DIAGNOSIS — Z880 Allergy status to penicillin: Secondary | ICD-10-CM | POA: Diagnosis not present

## 2021-08-19 DIAGNOSIS — Z79899 Other long term (current) drug therapy: Secondary | ICD-10-CM | POA: Diagnosis not present

## 2021-08-19 DIAGNOSIS — K641 Second degree hemorrhoids: Secondary | ICD-10-CM | POA: Insufficient documentation

## 2021-08-19 DIAGNOSIS — K644 Residual hemorrhoidal skin tags: Secondary | ICD-10-CM | POA: Insufficient documentation

## 2021-08-19 HISTORY — PX: EVALUATION UNDER ANESTHESIA WITH HEMORRHOIDECTOMY: SHX5624

## 2021-08-19 SURGERY — EXAM UNDER ANESTHESIA WITH HEMORRHOIDECTOMY
Anesthesia: General | Site: Rectum

## 2021-08-19 MED ORDER — LIDOCAINE HCL (CARDIAC) PF 100 MG/5ML IV SOSY
PREFILLED_SYRINGE | INTRAVENOUS | Status: DC | PRN
  Administered 2021-08-19: 50 mg via INTRAVENOUS

## 2021-08-19 MED ORDER — OXYCODONE HCL 5 MG/5ML PO SOLN: 5.0000 mg | Freq: Once | ORAL | Status: AC | PRN

## 2021-08-19 MED ORDER — BUPIVACAINE LIPOSOME 1.3 % IJ SUSP
INTRAMUSCULAR | Status: AC
  Filled 2021-08-19: qty 20

## 2021-08-19 MED ORDER — PROPOFOL 10 MG/ML IV BOLUS
INTRAVENOUS | Status: DC | PRN
  Administered 2021-08-19 (×3): 20 mg via INTRAVENOUS

## 2021-08-19 MED ORDER — ONDANSETRON HCL 4 MG/2ML IJ SOLN
INTRAMUSCULAR | Status: DC | PRN
  Administered 2021-08-19: 4 mg via INTRAVENOUS

## 2021-08-19 MED ORDER — GABAPENTIN 300 MG PO CAPS
ORAL_CAPSULE | ORAL | Status: AC
  Filled 2021-08-19: qty 1

## 2021-08-19 MED ORDER — BUPIVACAINE LIPOSOME 1.3 % IJ SUSP
INTRAMUSCULAR | Status: DC | PRN
  Administered 2021-08-19: 20 mL

## 2021-08-19 MED ORDER — FENTANYL CITRATE (PF) 100 MCG/2ML IJ SOLN
INTRAMUSCULAR | Status: AC
  Filled 2021-08-19: qty 2

## 2021-08-19 MED ORDER — PROPOFOL 500 MG/50ML IV EMUL
INTRAVENOUS | Status: DC | PRN
  Administered 2021-08-19: 100 ug/kg/min via INTRAVENOUS

## 2021-08-19 MED ORDER — DEXAMETHASONE SODIUM PHOSPHATE 10 MG/ML IJ SOLN
INTRAMUSCULAR | Status: DC | PRN
  Administered 2021-08-19: 10 mg via INTRAVENOUS

## 2021-08-19 MED ORDER — CHLORHEXIDINE GLUCONATE 0.12 % MT SOLN
15.0000 mL | Freq: Once | OROMUCOSAL | Status: AC
Start: 2021-08-19 — End: 2021-08-19
  Administered 2021-08-19: 15 mL via OROMUCOSAL

## 2021-08-19 MED ORDER — FENTANYL CITRATE (PF) 100 MCG/2ML IJ SOLN: 25.0000 ug | INTRAMUSCULAR | Status: DC | PRN

## 2021-08-19 MED ORDER — ORAL CARE MOUTH RINSE: 15.0000 mL | Freq: Once | OROMUCOSAL | Status: AC

## 2021-08-19 MED ORDER — DEXMEDETOMIDINE (PRECEDEX) IN NS 20 MCG/5ML (4 MCG/ML) IV SYRINGE
PREFILLED_SYRINGE | INTRAVENOUS | Status: DC | PRN
  Administered 2021-08-19: 4 ug via INTRAVENOUS

## 2021-08-19 MED ORDER — HEMOSTATIC AGENTS (NO CHARGE) OPTIME
TOPICAL | Status: DC | PRN
  Administered 2021-08-19: 1 via TOPICAL

## 2021-08-19 MED ORDER — LIDOCAINE 5 % EX OINT: 1.0000 "application " | TOPICAL_OINTMENT | Freq: Three times a day (TID) | CUTANEOUS | 0 refills | Status: DC | PRN

## 2021-08-19 MED ORDER — PHENYLEPHRINE HCL (PRESSORS) 10 MG/ML IV SOLN
INTRAVENOUS | Status: AC
  Filled 2021-08-19: qty 1

## 2021-08-19 MED ORDER — CELECOXIB 200 MG PO CAPS
200.0000 mg | ORAL_CAPSULE | ORAL | Status: AC
  Administered 2021-08-19: 200 mg via ORAL

## 2021-08-19 MED ORDER — MIDAZOLAM HCL 2 MG/2ML IJ SOLN
INTRAMUSCULAR | Status: DC | PRN
  Administered 2021-08-19: 2 mg via INTRAVENOUS

## 2021-08-19 MED ORDER — MEPERIDINE HCL 25 MG/ML IJ SOLN: 6.2500 mg | INTRAMUSCULAR | Status: DC | PRN

## 2021-08-19 MED ORDER — OXYCODONE HCL 5 MG PO TABS: 5.0000 mg | ORAL_TABLET | Freq: Once | ORAL | Status: AC | PRN

## 2021-08-19 MED ORDER — LACTATED RINGERS IV SOLN: INTRAVENOUS | Status: DC

## 2021-08-19 MED ORDER — PROMETHAZINE HCL 25 MG/ML IJ SOLN: 6.2500 mg | INTRAMUSCULAR | Status: DC | PRN

## 2021-08-19 MED ORDER — MIDAZOLAM HCL 2 MG/2ML IJ SOLN
INTRAMUSCULAR | Status: AC
  Filled 2021-08-19: qty 2

## 2021-08-19 MED ORDER — ACETAMINOPHEN 500 MG PO TABS
ORAL_TABLET | ORAL | Status: AC
  Filled 2021-08-19: qty 2

## 2021-08-19 MED ORDER — BUPIVACAINE-EPINEPHRINE (PF) 0.5% -1:200000 IJ SOLN
INTRAMUSCULAR | Status: AC
  Filled 2021-08-19: qty 30

## 2021-08-19 MED ORDER — CELECOXIB 200 MG PO CAPS
ORAL_CAPSULE | ORAL | Status: AC
  Filled 2021-08-19: qty 1

## 2021-08-19 MED ORDER — CHLORHEXIDINE GLUCONATE 0.12 % MT SOLN
OROMUCOSAL | Status: AC
  Filled 2021-08-19: qty 15

## 2021-08-19 MED ORDER — PROPOFOL 10 MG/ML IV BOLUS
INTRAVENOUS | Status: AC
  Filled 2021-08-19: qty 40

## 2021-08-19 MED ORDER — HYDROCODONE-ACETAMINOPHEN 5-325 MG PO TABS: 1.0000 | ORAL_TABLET | Freq: Four times a day (QID) | ORAL | 0 refills | Status: DC | PRN

## 2021-08-19 MED ORDER — IBUPROFEN 800 MG PO TABS: 800.0000 mg | ORAL_TABLET | Freq: Three times a day (TID) | ORAL | 0 refills | Status: DC | PRN

## 2021-08-19 MED ORDER — BUPIVACAINE-EPINEPHRINE (PF) 0.5% -1:200000 IJ SOLN
INTRAMUSCULAR | Status: DC | PRN
  Administered 2021-08-19: 30 mL

## 2021-08-19 MED ORDER — GELATIN ABSORBABLE 100 CM EX MISC
CUTANEOUS | Status: AC
  Filled 2021-08-19: qty 1

## 2021-08-19 MED ORDER — DOCUSATE SODIUM 100 MG PO CAPS: 100.0000 mg | ORAL_CAPSULE | Freq: Two times a day (BID) | ORAL | 0 refills | Status: AC | PRN

## 2021-08-19 MED ORDER — ACETAMINOPHEN 500 MG PO TABS
1000.0000 mg | ORAL_TABLET | ORAL | Status: AC
  Administered 2021-08-19: 1000 mg via ORAL

## 2021-08-19 MED ORDER — OXYCODONE HCL 5 MG PO TABS
ORAL_TABLET | ORAL | Status: AC
  Administered 2021-08-19: 5 mg via ORAL
  Filled 2021-08-19: qty 1

## 2021-08-19 MED ORDER — LIDOCAINE HCL (PF) 1 % IJ SOLN
INTRAMUSCULAR | Status: AC
  Filled 2021-08-19: qty 30

## 2021-08-19 MED ORDER — ACETAMINOPHEN 325 MG PO TABS: 650.0000 mg | ORAL_TABLET | Freq: Three times a day (TID) | ORAL | 0 refills | Status: AC | PRN

## 2021-08-19 MED ORDER — CHLORHEXIDINE GLUCONATE CLOTH 2 % EX PADS: 6.0000 | MEDICATED_PAD | Freq: Once | CUTANEOUS | Status: DC

## 2021-08-19 MED ORDER — FENTANYL CITRATE (PF) 100 MCG/2ML IJ SOLN
INTRAMUSCULAR | Status: DC | PRN
  Administered 2021-08-19 (×4): 25 ug via INTRAVENOUS

## 2021-08-19 MED ORDER — GABAPENTIN 300 MG PO CAPS
300.0000 mg | ORAL_CAPSULE | ORAL | Status: AC
  Administered 2021-08-19: 300 mg via ORAL

## 2021-08-19 SURGICAL SUPPLY — 30 items
BLADE SURG 15 STRL LF DISP TIS (BLADE) ×1 IMPLANT
BLADE SURG 15 STRL SS (BLADE) ×2
BRIEF STRETCH FOR OB PAD XXL (UNDERPADS AND DIAPERS) ×2 IMPLANT
DRAPE PERI LITHO V/GYN (MISCELLANEOUS) ×2 IMPLANT
DRAPE UNDER BUTTOCK W/FLU (DRAPES) ×2 IMPLANT
DRSG GAUZE FLUFF 36X18 (GAUZE/BANDAGES/DRESSINGS) ×2 IMPLANT
ELECT REM PT RETURN 9FT ADLT (ELECTROSURGICAL) ×2
ELECTRODE REM PT RTRN 9FT ADLT (ELECTROSURGICAL) ×1 IMPLANT
GAUZE 4X4 16PLY ~~LOC~~+RFID DBL (SPONGE) ×2 IMPLANT
GLOVE SURG SYN 6.5 ES PF (GLOVE) ×6 IMPLANT
GLOVE SURG UNDER POLY LF SZ7 (GLOVE) ×6 IMPLANT
GOWN STRL REUS W/ TWL LRG LVL3 (GOWN DISPOSABLE) ×3 IMPLANT
GOWN STRL REUS W/TWL LRG LVL3 (GOWN DISPOSABLE) ×6
KIT TURNOVER CYSTO (KITS) ×2 IMPLANT
LABEL OR SOLS (LABEL) ×2 IMPLANT
MANIFOLD NEPTUNE II (INSTRUMENTS) ×2 IMPLANT
NEEDLE HYPO 22GX1.5 SAFETY (NEEDLE) ×2 IMPLANT
NEEDLE HYPO 25X1 1.5 SAFETY (NEEDLE) ×2 IMPLANT
NS IRRIG 500ML POUR BTL (IV SOLUTION) ×2 IMPLANT
PACK BASIN MINOR ARMC (MISCELLANEOUS) ×2 IMPLANT
PAD PREP 24X41 OB/GYN DISP (PERSONAL CARE ITEMS) IMPLANT
SHEARS HARMONIC 9CM CVD (BLADE) IMPLANT
SOL PREP PVP 2OZ (MISCELLANEOUS) ×2
SOLUTION PREP PVP 2OZ (MISCELLANEOUS) ×1 IMPLANT
SURGILUBE 2OZ TUBE FLIPTOP (MISCELLANEOUS) ×2 IMPLANT
SUT VIC AB 3-0 SH 27 (SUTURE)
SUT VIC AB 3-0 SH 27X BRD (SUTURE) IMPLANT
SYR 10ML LL (SYRINGE) ×2 IMPLANT
SYR 20ML LL LF (SYRINGE) ×2 IMPLANT
WATER STERILE IRR 500ML POUR (IV SOLUTION) IMPLANT

## 2021-08-19 NOTE — Op Note (Addendum)
Preoperative diagnosis: second degree hemorrhoids, external hemorrhoid  Postoperative diagnosis: same  Procedure: exam under anesthesia, two column hemorrhoidectomy.  Surgeon: Tonna Boehringer  Anesthesia: general  Specimen: hemorrhoids x2  Complications: none  EBL: 61mL  Wound classification: Clean Contaminated  Indications: Patient is a 30 y.o. male was found to have symptomatic hemorrhoids refractory to medical management.   Findings: 1. second  degree hemorrhoids 2. Internal and external anal sphincter palpated and preserved 3. Adequate hemostasis  Description of procedure: The patient was brought to the operating room and general anesthesia was induced. Patient was placed high lithotomy position. A time-out was completed verifying correct patient, procedure, site, positioning, and implant(s) and/or special equipment prior to beginning this procedure. The perineum was prepped and draped in standard sterile fashion. Local anesthetic was injected as a perianal block. An anoscope was introduced and hemorrhoidal pedicles identified.  A harmonic was placed across the base of the left posterior pedicle and excess hemorrhoidal tissue removed.  Specimen was passed off operative field pending pathology.  3-0 Vicryl then used to close the open wound.  A harmonic was placed across the base of the right anterior pedicle and associated external hemorrhoid and excess hemorrhoidal tissue removed.  Specimen was passed off operative field pending pathology.  3-0 Vicryl then used to close the open wound.  Hemostasis confirmed. Last inspection of the anal canal did not note any additional hemorrhoidal tissue and no other pathology. Exparel injected as a perianal block. A gauze pad was tucked between the gluteal folds, and secured in place with mesh underwear.  The patient tolerated the procedure well and was taken to the postanesthesia care unit in stable condition.  Sponge and instrument count correct at end  of procedure.

## 2021-08-19 NOTE — Transfer of Care (Signed)
Immediate Anesthesia Transfer of Care Note  Patient: Bryce Hall  Procedure(s) Performed: EXAM UNDER ANESTHESIA WITH HEMORRHOIDECTOMY (Rectum)  Patient Location: PACU  Anesthesia Type:General  Level of Consciousness: awake, alert  and oriented  Airway & Oxygen Therapy: Patient Spontanous Breathing  Post-op Assessment: Report given to RN and Post -op Vital signs reviewed and stable  Post vital signs: Reviewed and stable  Last Vitals:  Vitals Value Taken Time  BP 128/82 08/19/21 0825  Temp 36.1 C 08/19/21 0826  Pulse 77 08/19/21 0828  Resp 22 08/19/21 0828  SpO2 100 % 08/19/21 0828  Vitals shown include unvalidated device data.  Last Pain:  Vitals:   08/19/21 0627  TempSrc: Oral  PainSc: 0-No pain         Complications: No notable events documented.

## 2021-08-19 NOTE — Discharge Instructions (Addendum)
Hemorrhoids, Care After This sheet gives you information about how to care for yourself after your procedure. Your health care provider may also give you more specific instructions. If you have problems or questions, contact your health care provider. What can I expect after the procedure? After the procedure, it is common to have: Soreness. Bruising. Itching.  Follow these instructions at home: site care Follow instructions from your health care provider about how to take care of your site. Make sure you: LEAVE packing in place until it falls out on its own.  No need to replace afterwards Leave stitches (sutures), skin glue, or adhesive strips in place.  If the area bleeds or bruises, apply gentle pressure for 10 minutes. OK TO SHOWER IN 24HRS  General instructions Rest and then return to your normal activities as told by your health care provider. tylenol and advil as needed for discomfort.  Please alternate between the two every four hours as needed for pain.    Use narcotics, if prescribed, only when tylenol and motrin is not enough to control pain.  325-650mg every 8hrs to max of 3000mg/24hrs (including the 325mg in every norco dose) for the tylenol.    Advil up to 800mg per dose every 8hrs as needed for pain.   Keep all follow-up visits as told by your health care provider. This is important. Contact a health care provider if you have excessive: redness, swelling, or pain around your site. blood coming from your site. pus or a bad smell coming from your site. You have a fever.  Get help right away if: You have bleeding that does not stop with pressure or a dressing. Summary After the procedure, it is common to have some soreness, bruising, and itching at the site. Follow instructions from your health care provider about how to take care of your site. Keep all follow-up visits as told by your health care provider. This is important. This information is not intended to replace  advice given to you by your health care provider. Make sure you discuss any questions you have with your health care provider. Document Released: 11/19/2015 Document Revised: 04/22/2018 Document Reviewed: 04/22/2018 Elsevier Interactive Patient Education  2019 [employer]  AMBULATORY SURGERY  DISCHARGE INSTRUCTIONS   The drugs that you were given will stay in your system until tomorrow so for the next 24 hours you should not:  Drive an automobile Make any legal decisions Drink any alcoholic beverage   You may resume regular meals tomorrow.  Today it is better to start with liquids and gradually work up to solid foods.  You may eat anything you prefer, but it is better to start with liquids, then soup and crackers, and gradually work up to solid foods.   Please notify your doctor immediately if you have any unusual bleeding, trouble breathing, redness and pain at the surgery site, drainage, fever, or pain not relieved by medication.    Additional Instructions:     Please contact your physician with any problems or Same Day Surgery at [phone], Monday through Friday 6 am to 4 pm, or [facility] [facility] number at [phone].  

## 2021-08-19 NOTE — Interval H&P Note (Signed)
History and Physical Interval Note:  08/19/2021 7:25 AM  Bryce Hall  has presented today for surgery, with the diagnosis of K64.2 Grade III hemorrhoids.  The various methods of treatment have been discussed with the patient and family. After consideration of risks, benefits and other options for treatment, the patient has consented to  Procedure(s): EXAM UNDER ANESTHESIA WITH HEMORRHOIDECTOMY (N/A) as a surgical intervention.  The patient's history has been reviewed, patient examined, no change in status, stable for surgery.  I have reviewed the patient's chart and labs.  Questions were answered to the patient's satisfaction.     Shaakira Borrero Tonna Boehringer

## 2021-08-19 NOTE — Anesthesia Preprocedure Evaluation (Signed)
Anesthesia Evaluation  Patient identified by MRN, date of birth, ID band Patient awake    Reviewed: Allergy & Precautions, NPO status , Patient's Chart, lab work & pertinent test results  History of Anesthesia Complications Negative for: history of anesthetic complications  Airway Mallampati: I  TM Distance: >3 FB Neck ROM: Full    Dental no notable dental hx.    Pulmonary neg pulmonary ROS, neg sleep apnea, neg COPD,    breath sounds clear to auscultation- rhonchi (-) wheezing      Cardiovascular Exercise Tolerance: Good (-) hypertension(-) CAD and (-) Past MI  Rhythm:Regular Rate:Normal - Systolic murmurs and - Diastolic murmurs    Neuro/Psych PSYCHIATRIC DISORDERS Anxiety negative neurological ROS     GI/Hepatic negative GI ROS, Neg liver ROS,   Endo/Other  negative endocrine ROSneg diabetes  Renal/GU negative Renal ROS     Musculoskeletal negative musculoskeletal ROS (+)   Abdominal (+) - obese,   Peds  Hematology negative hematology ROS (+)   Anesthesia Other Findings Past Medical History: No date: Anxiety   Reproductive/Obstetrics                             Anesthesia Physical Anesthesia Plan  ASA: 1  Anesthesia Plan: General   Post-op Pain Management:    Induction: Intravenous  PONV Risk Score and Plan: 1 and Propofol infusion  Airway Management Planned: Natural Airway  Additional Equipment:   Intra-op Plan:   Post-operative Plan:   Informed Consent: I have reviewed the patients History and Physical, chart, labs and discussed the procedure including the risks, benefits and alternatives for the proposed anesthesia with the patient or authorized representative who has indicated his/her understanding and acceptance.     Dental advisory given  Plan Discussed with: CRNA and Anesthesiologist  Anesthesia Plan Comments:         Anesthesia Quick Evaluation

## 2021-08-19 NOTE — Anesthesia Postprocedure Evaluation (Signed)
Anesthesia Post Note  Patient: Bryce Hall  Procedure(s) Performed: EXAM UNDER ANESTHESIA WITH HEMORRHOIDECTOMY (Rectum)  Patient location during evaluation: PACU Anesthesia Type: General Level of consciousness: awake and alert and oriented Pain management: pain level controlled Vital Signs Assessment: post-procedure vital signs reviewed and stable Respiratory status: spontaneous breathing, nonlabored ventilation and respiratory function stable Cardiovascular status: blood pressure returned to baseline and stable Postop Assessment: no signs of nausea or vomiting Anesthetic complications: no   No notable events documented.   Last Vitals:  Vitals:   08/19/21 0854 08/19/21 0910  BP: 129/80 131/85  Pulse: (!) 56 (!) 53  Resp: 20 20  Temp: (!) 36.1 C (!) 36.3 C  SpO2: 100% 100%    Last Pain:  Vitals:   08/19/21 0910  TempSrc:   PainSc: 4                  Franshesca Chipman

## 2021-08-22 LAB — SURGICAL PATHOLOGY

## 2022-01-02 ENCOUNTER — Emergency Department: Payer: 59

## 2022-01-02 ENCOUNTER — Emergency Department
Admission: EM | Admit: 2022-01-02 | Discharge: 2022-01-02 | Disposition: A | Discharge status: Home / Self Care | Payer: 59 | Attending: Emergency Medicine | Admitting: Emergency Medicine

## 2022-01-02 ENCOUNTER — Other Ambulatory Visit: Payer: Self-pay

## 2022-01-02 DIAGNOSIS — Z20822 Contact with and (suspected) exposure to covid-19: Secondary | ICD-10-CM | POA: Insufficient documentation

## 2022-01-02 DIAGNOSIS — R519 Headache, unspecified: Secondary | ICD-10-CM

## 2022-01-02 DIAGNOSIS — R4182 Altered mental status, unspecified: Secondary | ICD-10-CM | POA: Insufficient documentation

## 2022-01-02 HISTORY — DX: Irritable bowel syndrome without diarrhea: K58.9

## 2022-01-02 LAB — RESP PANEL BY RT-PCR (FLU A&B, COVID) ARPGX2
Influenza A by PCR: NEGATIVE
Influenza B by PCR: NEGATIVE
SARS Coronavirus 2 by RT PCR: NEGATIVE

## 2022-01-02 LAB — CBC
HCT: 44.7 % (ref 39.0–52.0)
Hemoglobin: 14.5 g/dL (ref 13.0–17.0)
MCH: 28 pg (ref 26.0–34.0)
MCHC: 32.4 g/dL (ref 30.0–36.0)
MCV: 86.3 fL (ref 80.0–100.0)
Platelets: 264 10*3/uL (ref 150–400)
RBC: 5.18 MIL/uL (ref 4.22–5.81)
RDW: 12.3 % (ref 11.5–15.5)
WBC: 6.3 10*3/uL (ref 4.0–10.5)
nRBC: 0 % (ref 0.0–0.2)

## 2022-01-02 LAB — COMPREHENSIVE METABOLIC PANEL
ALT: 32 U/L (ref 0–44)
AST: 28 U/L (ref 15–41)
Albumin: 4.8 g/dL (ref 3.5–5.0)
Alkaline Phosphatase: 91 U/L (ref 38–126)
Anion gap: 9 (ref 5–15)
BUN: 11 mg/dL (ref 6–20)
CO2: 29 mmol/L (ref 22–32)
Calcium: 9.9 mg/dL (ref 8.9–10.3)
Chloride: 100 mmol/L (ref 98–111)
Creatinine, Ser: 1.03 mg/dL (ref 0.61–1.24)
GFR, Estimated: >60 mL/min (ref >60)
Glucose, Bld: 92 mg/dL (ref 70–99)
Potassium: 4.7 mmol/L (ref 3.5–5.1)
Sodium: 138 mmol/L (ref 135–145)
Total Bilirubin: 0.8 mg/dL (ref 0.3–1.2)
Total Protein: 8.3 g/dL — ABNORMAL HIGH (ref 6.5–8.1)

## 2022-01-02 NOTE — ED Notes (Signed)
See triage note  presents with some "brain fog"  states this started on 02/03  and has been intermittent  also has had occasional headache  pain mainly to temporal and frontal area

## 2022-01-02 NOTE — ED Notes (Signed)
Attempted x1 for blood-work.

## 2022-01-02 NOTE — ED Provider Triage Note (Signed)
°  Emergency Medicine Provider Triage Evaluation Note  Bryce Hall , a 31 y.o.male,  was evaluated in triage.  Pt complains of brain fog.  Patient states that he has been feeling dizzy and foggy for the past few months.  Additionally endorses erection issues.  Denies any numbness/tingling in lower extremities, back pain, abdominal pain, or nausea/vomiting.   Review of Systems  Positive: Brain fog, dizziness, erection issues Negative: Denies fever, chest pain, vomiting  Physical Exam   Vitals:   01/02/22 1616  BP: (!) 138/96  Pulse: 67  Resp: 17  Temp: 98.2 F (36.8 C)  SpO2: 100%   Gen:   Awake, no distress   Resp:  Normal effort  MSK:   Moves extremities without difficulty  Other:    Medical Decision Making  Given the patient's initial medical screening exam, the following diagnostic evaluation has been ordered. The patient will be placed in the appropriate treatment space, once one is available, to complete the evaluation and treatment. I have discussed the plan of care with the patient and I have advised the patient that an ED physician or mid-level practitioner will reevaluate their condition after the test results have been received, as the results may give them additional insight into the type of treatment they may need.    Diagnostics: Labs, respiratory panel.  Treatments: none immediately   Varney Daily, [place] 01/02/22 1639

## 2022-01-02 NOTE — ED Triage Notes (Signed)
Pt in from work due to "brain-fog"; pressure HA; family covid positive; pt did at-home test that was negative; erection issues for a few months; pt history of anxiety. A&Ox4.

## 2022-01-02 NOTE — ED Notes (Addendum)
Pt denies dizziness, balance issues, slurred speech, diabetes or other BG-related issues. Pt in NAD. Pt reports "it feels like I'm high"; pt answers all questions correctly but is slow to answer at times. Pt denies CP.

## 2022-01-02 NOTE — ED Provider Notes (Signed)
[facility] Provider Note    Event Date/Time   First MD Initiated Contact with Patient 01/02/22 1742     (approximate)   History   Altered Mental Status   HPI  Bryce Hall is a 31 y.o. male with a past medical history of IBS, anxiety, depression and self-reported OCD although it seems patient is not currently being treated for any of the psychiatric symptoms who presents for evaluation of several concerns including intermittent headaches, nausea, difficulty obtaining erection and brain fog.  States has been ongoing over the last couple weeks.  No recent injuries or falls, fevers, vertigo, chest pain, cough, vomiting, no abdominal pain, new diarrhea, any urinary symptoms or rash or focal extremity weakness numbness or tingling.  States he used to use CBD but has not used any in over a week and denies any other illicit drug use, tobacco abuse or EtOH use.  States he feels his headache is better when he eats and that is worse when he is hungry or feeling particular anxious.  No other acute concerns at this time.      Physical Exam  Triage Vital Signs: ED Triage Vitals  Enc Vitals Group     BP 01/02/22 1616 (!) 138/96     Pulse Rate 01/02/22 1616 67     Resp 01/02/22 1616 17     Temp 01/02/22 1616 98.2 F (36.8 C)     Temp Source 01/02/22 1616 Oral     SpO2 01/02/22 1616 100 %     Weight 01/02/22 1617 139 lb (63 kg)     Height 01/02/22 1617 5\' 6"  (1.676 m)     Head Circumference --      Peak Flow --      Pain Score 01/02/22 1617 3     Pain Loc --      Pain Edu? --      Excl. in GC? --     Most recent vital signs: Vitals:   01/02/22 1616  BP: (!) 138/96  Pulse: 67  Resp: 17  Temp: 98.2 F (36.8 C)  SpO2: 100%    General: Awake, no distress.  CV:  Good peripheral perfusion.  Resp:  Normal effort.  Abd:  No distention.  Other:  Cranial nerves II through XII grossly intact.  No pronator drift.  No finger dysmetria.  Symmetric 5/5 strength of  all extremities.  Sensation intact to light touch in all extremities.  Unremarkable unassisted gait.  Oropharynx and TMs are unremarkable bilaterally.  Patient does not appear intoxicated or encephalopathic.   ED Results / Procedures / Treatments  Labs (all labs ordered are listed, but only abnormal results are displayed) Labs Reviewed  COMPREHENSIVE METABOLIC PANEL - Abnormal; Notable for the following components:      Result Value   Total Protein 8.3 (*)    All other components within normal limits  RESP PANEL BY RT-PCR (FLU A&B, COVID) ARPGX2  CBC     EKG \  RADIOLOGY CT head interpreted by myself shows no evidence of acute intracranial hemorrhage, mass effect, edema or other acute intracranial process.  I also reviewed radiology's interpretation and agree with the findings.  PROCEDURES:  Critical Care performed: No  Procedures  MEDICATIONS ORDERED IN ED: Medications - No data to display   IMPRESSION / MDM / ASSESSMENT AND PLAN / ED COURSE  I reviewed the triage vital signs and the nursing notes.  Differential diagnosis includes, but is not limited to atypical migraine, effects of IBS, effects of untreated psychiatric condition with low suspicion based on patient's history, exam and ED work-up including CT and labs for meningitis, CVT, significant metabolic derangement, anemia, proceeding trauma, toxic ingestion, withdrawal, or acute endocrine derangement.  CT head I reviewed is negative.  CBC and CMP are unremarkable.  COVID influenza test is negative.  Unclear if patient's erectile concerns are related to his headache and brain fog.  At this point I have low suspicion for immediate life-threatening process I think patient is stable for discharge with outpatient follow-up.  We will have him follow-up with PCP, neurology and urology.      FINAL CLINICAL IMPRESSION(S) / ED DIAGNOSES   Final diagnoses:  Nonintractable headache, unspecified  chronicity pattern, unspecified headache type     Rx / DC Orders   ED Discharge Orders     None        Note:  This document was prepared using Dragon voice recognition software and may include unintentional dictation errors.   Gilles Chiquito, MD 01/02/22 [phone]

## 2022-01-11 ENCOUNTER — Ambulatory Visit: Payer: 59 | Admitting: Urology

## 2022-01-11 ENCOUNTER — Other Ambulatory Visit: Payer: Self-pay

## 2022-01-11 ENCOUNTER — Encounter: Payer: Self-pay | Admitting: Urology

## 2022-01-11 VITALS — BP 129/89 | HR 68 | Ht 66.0 in | Wt 130.0 lb

## 2022-01-11 DIAGNOSIS — N529 Male erectile dysfunction, unspecified: Secondary | ICD-10-CM | POA: Diagnosis not present

## 2022-01-11 NOTE — Patient Instructions (Signed)
Hypogonadism, Male ?Male hypogonadism is a condition of having a level of testosterone that is lower than normal. Testosterone is a chemical, or hormone, that is made mainly in the testicles. ?In boys, testosterone is responsible for the development of male characteristics during puberty. These include: ?Making the penis bigger. ?Growing and building the muscles. ?Growing facial hair. ?Deepening the voice. ?In adult men, testosterone is responsible for maintaining: ?An interest in sex and the ability to have sex. ?Muscle mass. ?Sperm production. ?Red blood cell production. ?Bone strength. ?Testosterone also gives men energy and a sense of well-being. ?Testosterone normally decreases as men age and the testicles make less testosterone. Testosterone levels can vary from man to man. Not all men will have signs and symptoms of low testosterone. Weight, alcohol use, medicines, and certain medical conditions can affect a man's testosterone level. ?What are the causes? ?This condition is caused by: ?A natural decrease in testosterone that occurs as a man grows older. This is the main cause of this condition. ?Use of medicines, such as antidepressants, steroids, and opioids. ?Diseases and conditions that affect the testicles or the making of testosterone. These include: ?Injury or damage to the testicles from trauma, cancer, cancer treatment, or infection. ?Diabetes. ?Sleep apnea. ?Genetic conditions that men are born with. ?Disease of the pituitary gland. This gland is in the brain. It produces hormones. ?Obesity. ?Metabolic syndrome. This is a group of diseases that affect blood pressure, blood sugar, cholesterol, and belly fat. ?HIV or AIDS. ?Alcohol abuse. ?Kidney failure. ?Other long-term or chronic diseases. ?What are the signs or symptoms? ?Common symptoms of this condition include: ?Loss of interest in sex (low sex drive). ?Inability to have or maintain an erection (erectile dysfunction). ?Feeling tired  (fatigue). ?Mood changes, like irritability or depression. ?Loss of muscle and body hair. ?Infertility. ?Large breasts. ?Weight gain (obesity). ?How is this diagnosed? ?Your health care provider can diagnose hypogonadism based on: ?Your signs and symptoms. ?A physical exam to check your testosterone levels. This includes blood tests. Testosterone levels can change throughout the day. Levels are highest in the morning. You may need to have repeat blood tests before getting a diagnosis of hypogonadism. ?Depending on your medical history and test results, your health care provider may also do other tests to find the cause of low testosterone. ?How is this treated? ?This condition is treated with testosterone replacement therapy. Testosterone can be given by: ?Injection or through pellets inserted under the skin. ?Gels or patches placed on the skin or in the mouth. ?Testosterone therapy is not for everyone. It has risks and side effects. Your health care provider will consider your medical history, your risk for prostate cancer, your age, and your symptoms before putting you on testosterone replacement therapy. ?Follow these instructions at home: ?Take over-the-counter and prescription medicines only as told by your health care provider. ?Eat foods that are high in fiber, such as beans, whole grains, and fresh fruits and vegetables. Limit foods that are high in fat and processed sugars, such as fried or sweet foods. ?If you drink alcohol: ?Limit how much you have to 0-2 drinks a day. ?Know how much alcohol is in your drink. In the [place] one drink equals one 12 oz bottle of beer (355 mL), one 5 oz glass of wine (148 mL), or one 1? oz glass of hard liquor (44 mL). ?Return to your normal activities as told by your health care provider. Ask your health care provider what activities are safe for you. ?Keep all follow-up   visits. This is important. ?Contact a health care provider if: ?You have any of the signs or symptoms of  low testosterone. ?You have any side effects from testosterone therapy. ?Summary ?Male hypogonadism is a condition of having a level of testosterone that is lower than normal. ?The natural drop in testosterone production that occurs with age is the most common cause of this condition. ?Low testosterone can also be caused by many diseases and conditions that affect the testicles and the making of testosterone. ?This condition is treated with testosterone replacement therapy. ?There are risks and side effects of testosterone therapy. Your health care provider will consider your age, medical history, symptoms, and risks for prostate cancer before putting you on testosterone therapy. ?This information is not intended to replace advice given to you by your health care provider. Make sure you discuss any questions you have with your health care provider. ?Document Revised: 06/24/2020 Document Reviewed: 06/24/2020 ?Elsevier Patient Education ? 2022 [employer] ? ?

## 2022-01-11 NOTE — Progress Notes (Signed)
? ?  01/11/22 ?9:23 AM  ? ?Bryce Hall ?04-04-1991 ?[id number] ? ?CC: Erectile dysfunction ? ?HPI: ?31 year old male with anxiety depression(not currently on medications, working on getting back in with a therapist), IBS who reports decreased firmness of erections over the last few months.  He identifies as queer.  He has been celibate since 2016 by choice.  He is still able to achieve erections with stimulation, but these feel less firm than previously.  He wonders if this could be related to his anxiety.  He had a hemorrhoid procedure in October 2022, and feels like the erections have been worse since that time. ? ? ?PMH: ?Past Medical History:  ?Diagnosis Date  ? Anxiety   ? IBS (irritable bowel syndrome)   ? ? ?Surgical History: ?Past Surgical History:  ?Procedure Laterality Date  ? COLONOSCOPY    ? EVALUATION UNDER ANESTHESIA WITH HEMORRHOIDECTOMY N/A 08/19/2021  ? Procedure: EXAM UNDER ANESTHESIA WITH HEMORRHOIDECTOMY;  Surgeon: Sung Amabile, DO;  Location: [facility] ORS;  Service: General;  Laterality: N/A;  ? WISDOM TOOTH EXTRACTION    ? ? ?Family History: ?No family history on file. ? ?Social History:  reports that he has never smoked. He has never been exposed to tobacco smoke. He has never used smokeless tobacco. He reports that he does not currently use alcohol. He reports that he does not currently use drugs after having used the following drugs: Marijuana. ? ?Physical Exam: ?BP 129/89   Pulse 68   Ht 5\' 6"  (1.676 m)   Wt 130 lb (59 kg)   BMI 20.98 kg/m?   ? ?Constitutional:  Alert and oriented, No acute distress. ?Cardiovascular: No clubbing, cyanosis, or edema. ?Respiratory: Normal respiratory effort, no increased work of breathing. ?GI: Abdomen is soft, nontender, nondistended, no abdominal masses ?GU: Phallus with patent meatus, no lesions, testicles 15 cc and descended bilaterally, no masses ? ?Assessment & Plan:   ?31 year old male with decreased firmness of erections over the last few months, as well  as multiple other medical issues including untreated anxiety and depression.  He feels these could be related.  I reviewed the AUA guidelines regarding checking a testosterone, and consideration of a PDE 5 inhibitor.  We also discussed that more invasive test like penile Doppler do not typically change management, and I would recommend a trial of a PDE 5 inhibitor.  He does not want to take any medications at this time, but is in agreement with checking a testosterone.  We discussed possible options for replacement including testosterone replacement versus Clomid. ? ?Testosterone lab this a.m., call with results ?Encouraged him to consider medications for his anxiety/depression, as this could certainly be related ? ?26, MD ?01/11/2022 ? ?[facility] ?[address] ?[place], [place] ?[phone] ? ? ?

## 2022-01-12 LAB — TESTOSTERONE: Testosterone: 405 ng/dL (ref 264–916)

## 2022-12-29 IMAGING — CT CT HEAD W/O CM
3 of 5 series · 14 of 47 positions shown, 16 images · non-contrast
Comparison: None.

CLINICAL DATA: Mental status change, unknown cause



[Series 4: cor soft · coronal · 0.33mm/px · 3 of 75 slices shown]
[im 28/75  brain]
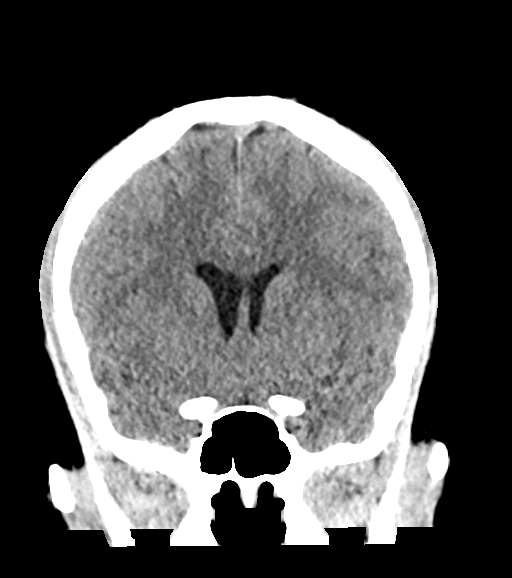
[im 34/75  brain]
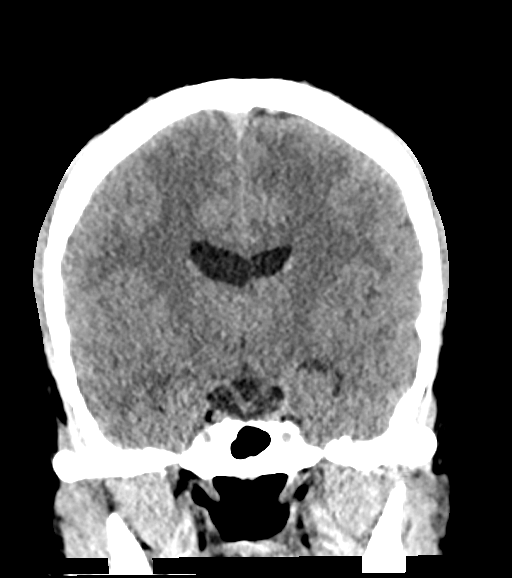
[im 41/75  brain]
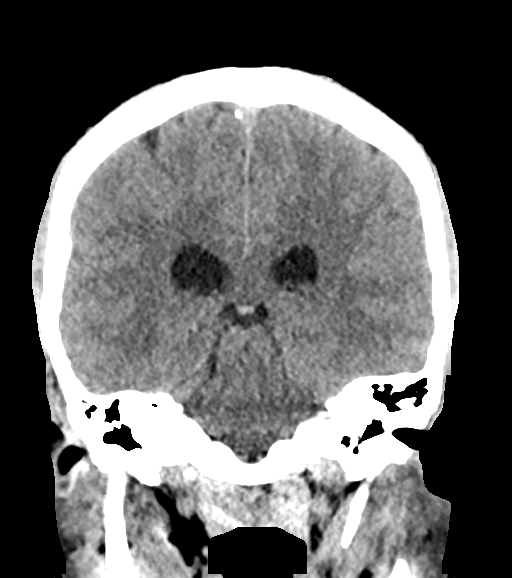

[Series 5: sag soft · sagittal · 0.38mm/px · 3 of 57 slices shown (1 of 2)]
[im 19/57  brain]
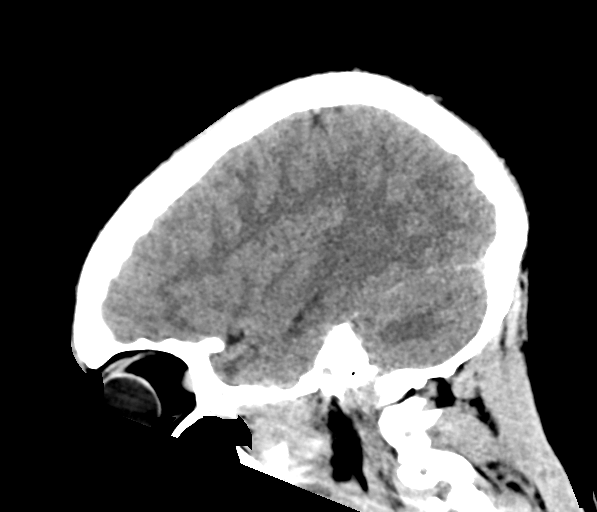
[im 29/57  brain]
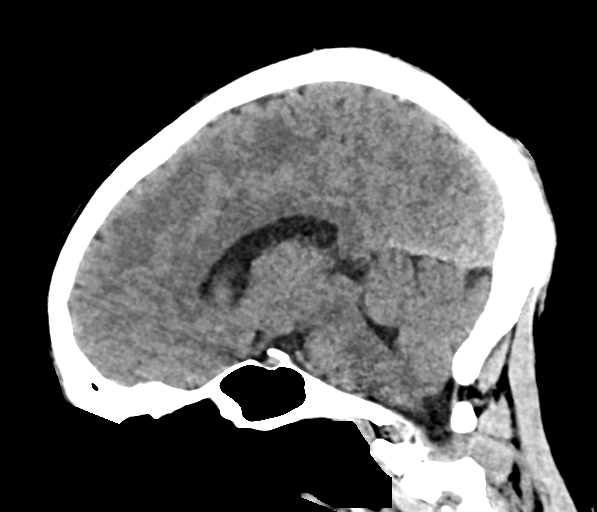
[im 38/57  brain]
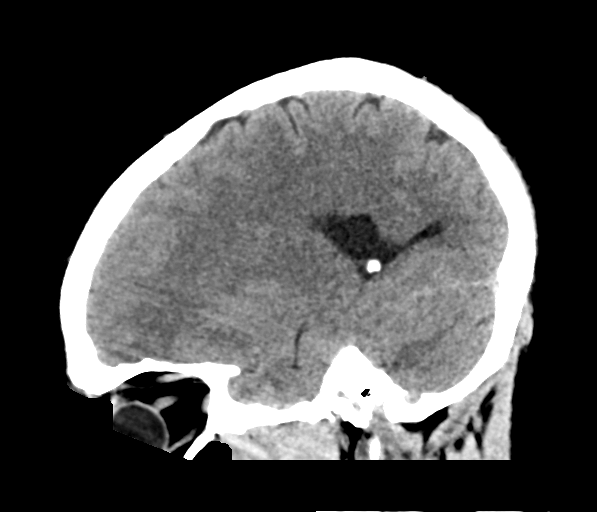

[Series 6: sag soft · axial · 0.33mm/px · z∈[-258,-118]mm · 8 of 65 slices shown, 10 images (2 of 2)]
[im 8/65  brain]
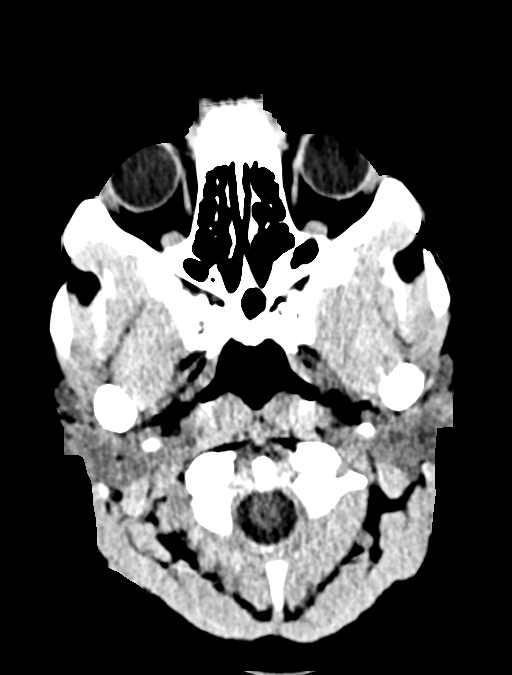
[im 8/65  bone]
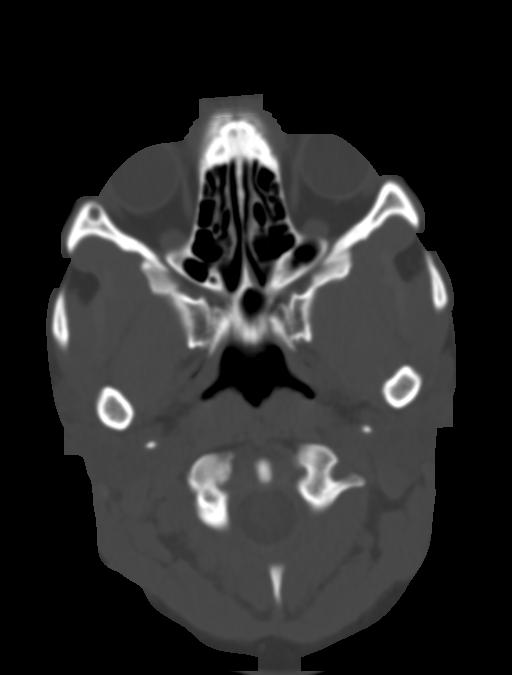
[im 15/65  brain]
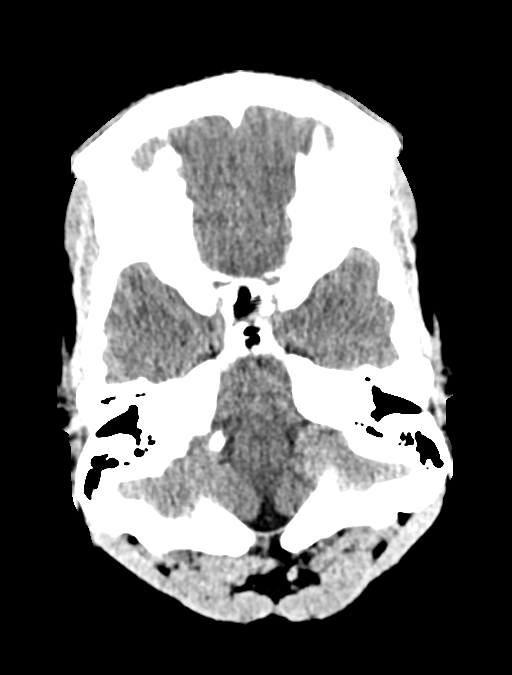
[im 22/65  brain]
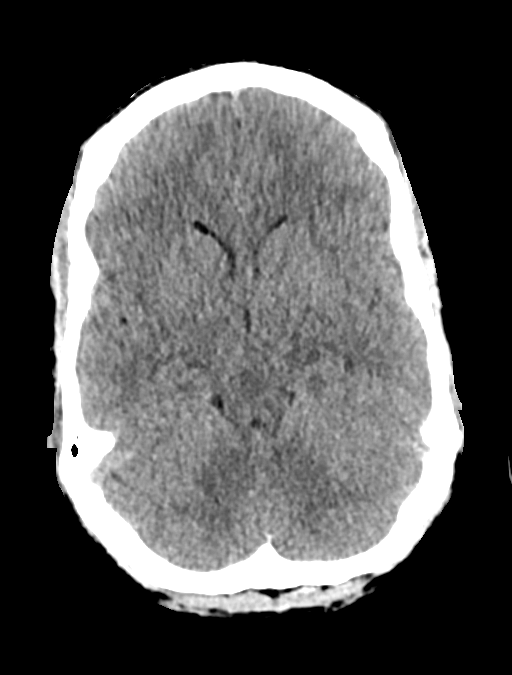
[im 29/65  brain]
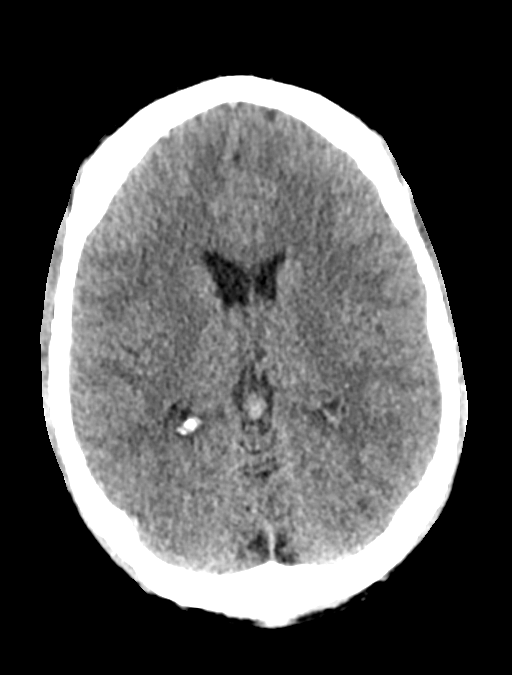
[im 36/65  brain]
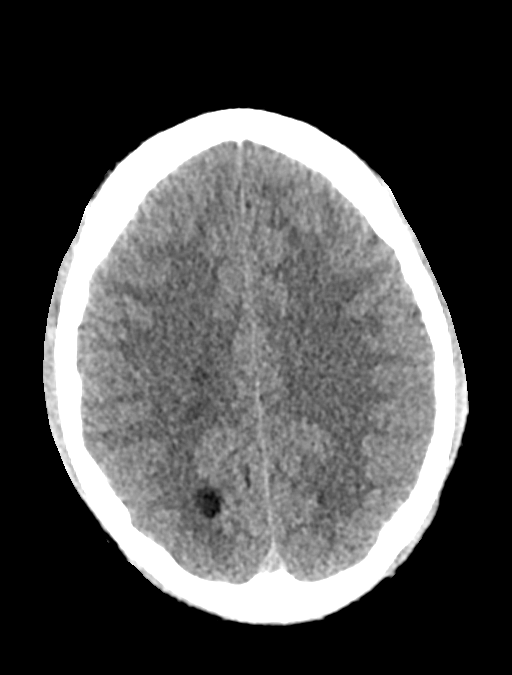
[im 36/65  bone]
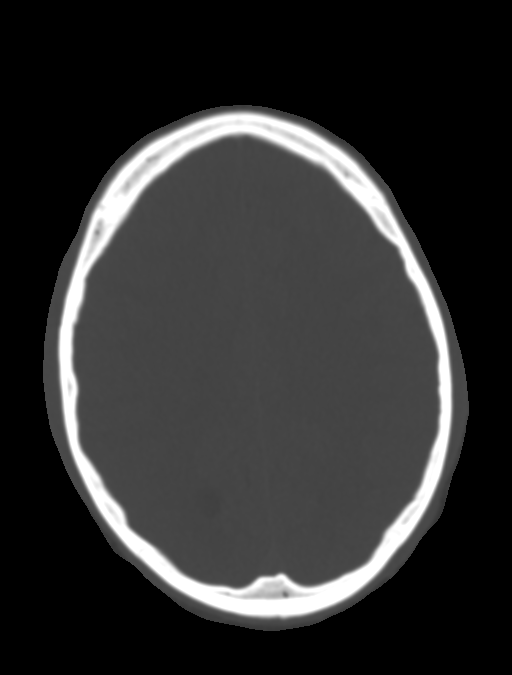
[im 43/65  brain]
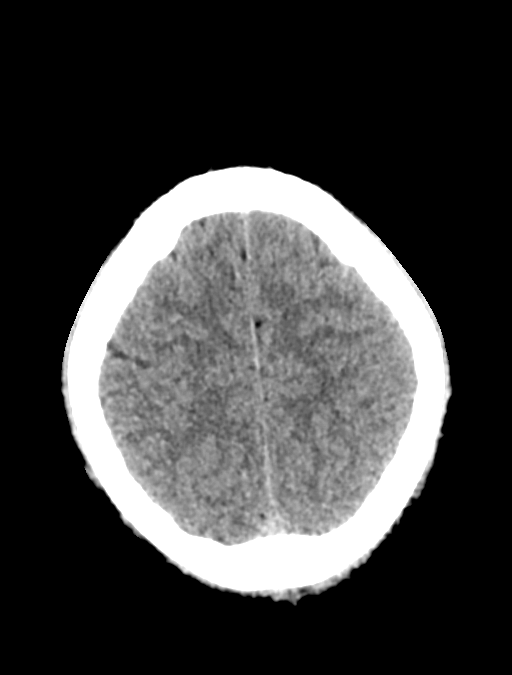
[im 50/65  brain]
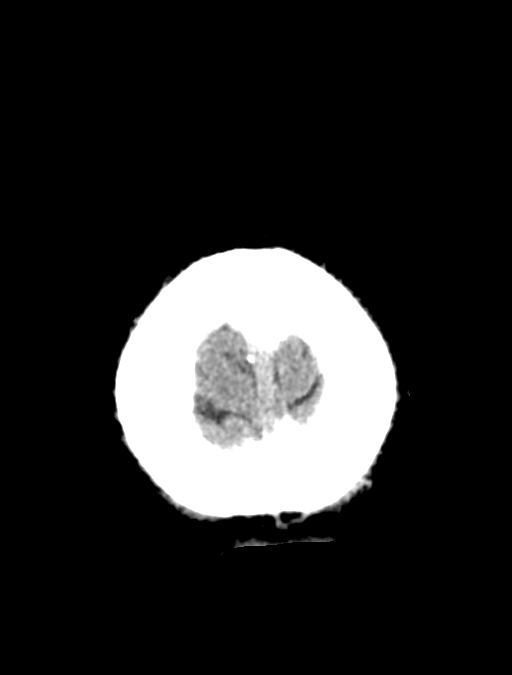
[im 57/65  brain]
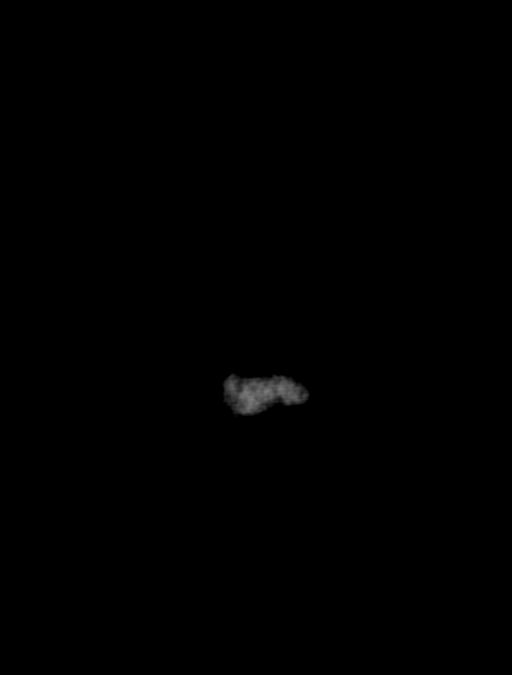

[14 of 47 positions shown; findings below may reference images not displayed]

FINDINGS: Brain:

No evidence of large-territorial acute infarction. No parenchymal
hemorrhage. No mass lesion. No extra-axial collection.

No mass effect or midline shift. No hydrocephalus. Basilar cisterns
are patent.

Vascular: No hyperdense vessel.

Skull: No acute fracture or focal lesion.

Sinuses/Orbits: Paranasal sinuses and mastoid air cells are clear.
The orbits are unremarkable.

Other: None.
IMPRESSION: No acute intracranial abnormality.
